# Patient Record
Sex: Female | Born: 1985 | Race: White | Hispanic: No | Marital: Single | Smoking: Never smoker
Health system: Southern US, Community
[De-identification: ages and names within clinical notes are randomized; demographics above are authoritative.]

## PROBLEM LIST (undated history)

## (undated) DIAGNOSIS — F32A Depression, unspecified: Secondary | ICD-10-CM

## (undated) DIAGNOSIS — R112 Nausea with vomiting, unspecified: Secondary | ICD-10-CM

## (undated) DIAGNOSIS — Z9889 Other specified postprocedural states: Secondary | ICD-10-CM

## (undated) DIAGNOSIS — O139 Gestational [pregnancy-induced] hypertension without significant proteinuria, unspecified trimester: Secondary | ICD-10-CM

## (undated) DIAGNOSIS — F329 Major depressive disorder, single episode, unspecified: Secondary | ICD-10-CM

## (undated) DIAGNOSIS — F419 Anxiety disorder, unspecified: Secondary | ICD-10-CM

## (undated) HISTORY — DX: Anxiety disorder, unspecified: F41.9

## (undated) HISTORY — PX: OTHER SURGICAL HISTORY: SHX169

## (undated) HISTORY — PX: WISDOM TOOTH EXTRACTION: SHX21

## (undated) HISTORY — PX: TONSILLECTOMY: SUR1361

---
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
## 2008-09-03 ENCOUNTER — Ambulatory Visit: Payer: Self-pay | Admitting: Diagnostic Radiology

## 2008-09-03 ENCOUNTER — Emergency Department (HOSPITAL_BASED_OUTPATIENT_CLINIC_OR_DEPARTMENT_OTHER): Admission: EM | Admit: 2008-09-03 | Discharge: 2008-09-03 | Payer: Self-pay | Admitting: Emergency Medicine

## 2011-11-02 ENCOUNTER — Ambulatory Visit (INDEPENDENT_AMBULATORY_CARE_PROVIDER_SITE_OTHER): Payer: 59 | Admitting: Behavioral Health

## 2011-11-02 DIAGNOSIS — Z638 Other specified problems related to primary support group: Secondary | ICD-10-CM

## 2011-11-02 DIAGNOSIS — F411 Generalized anxiety disorder: Secondary | ICD-10-CM

## 2011-11-02 DIAGNOSIS — F4322 Adjustment disorder with anxiety: Secondary | ICD-10-CM

## 2011-11-03 ENCOUNTER — Encounter (HOSPITAL_COMMUNITY): Payer: Self-pay | Admitting: Behavioral Health

## 2011-11-03 DIAGNOSIS — Z638 Other specified problems related to primary support group: Secondary | ICD-10-CM | POA: Insufficient documentation

## 2011-11-03 DIAGNOSIS — F411 Generalized anxiety disorder: Secondary | ICD-10-CM | POA: Insufficient documentation
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
## 2011-11-25 ENCOUNTER — Ambulatory Visit (INDEPENDENT_AMBULATORY_CARE_PROVIDER_SITE_OTHER): Payer: 59 | Admitting: Behavioral Health

## 2011-11-25 DIAGNOSIS — F411 Generalized anxiety disorder: Secondary | ICD-10-CM

## 2011-11-29 ENCOUNTER — Encounter (HOSPITAL_COMMUNITY): Payer: Self-pay | Admitting: Behavioral Health

## 2011-11-29 NOTE — Progress Notes (Signed)
THERAPIST PROGRESS NOTE  Session Time: 4:00  Participation Level: Active  Behavioral Response: CasualAlertAnxious/frustrated  Type of Therapy: Family Therapy  Treatment Goals addressed: Coping/communication  Interventions: Family Systems  Summary: Nancy Bennett is a 26 y.o. female who presents with anxiety.   Suicidal/Homicidal: Nowithout intent/plan  Therapist Response: I met with the client and her husband the first time today. I met previously with the client because her husband had just started a new job and could not leave work. We begin to explore some of the underlying issues related to him being in therapy. The clients husband indicated that he continues in intensive outpatient substance abuse therapy in [place]. He indicated that he is currently meeting weekly for group therapy and also meet with the psychiatrist there. He is on Suboxone treatments receiving no other pain medication. He indicated that 2 spinal conditions led to use of pain medication initially through a pain management clinic but when they found out that he was not using them correctly he was dismissed. He indicated that he began looking for pain medication on the street. He told the client for the first time that he contacted a mutual acquaintance of both of theirs who in turn referred him to someone else. He indicated that he had been buying pain medication in legally from 2 different people which was a surprise to the client. The client indicated that was a big part of what her struggles with is feeling that he is not giving her the full story of what took place as well as he has difficulty with emotional expression which has led to some trust issues. The client repeatedly expressed the fact, she values honesty and she cannot understand why he is not willing to "about what happened. He in part says that he feels guilty and ashamed of what he did and it is difficult to talk about but understands the need  for that. He also indicated that he is unsure what she is looking for. The client said that she does not want to go into work mode and interrogate the client and so is not a specific are directed she would be at work. The husband indicated that he would appreciate it if she would be more specific so he would know exactly how to answer. She indicated that he gives her very little feedback when she asked how he intensive outpatient therapy yes. He indicates that the same thing happens everyday and he doesn't see anything to elaborate on. She cited the fact that he talked about who he about the legal drugs from and wondered why she heard about that for the first time today. I suggested that we go back to a simple note is possible. I asked the client husband initially to sit and write down a time line of what is taking place since he first started taking pain medication but both client and husband did not for coping with that. I asked the client to write down a specific questions as possible wanted time and asked the husband to give all information that he has related to the question even if he thinks it is insignificant. The clients husband indicated that he is also always had a difficult time with emotional expression but it is even more difficult discussing issues related to him using pain medication and acquiring pain medication illegally. Asked the client her husband to work on one question at a time trying to connect intellectually to the client satisfaction hoping that this will  at least for some emotional expression with the husband. I will meet with them in 2 weeks  Plan: Return again in 2 weeks.  Diagnosis: Axis I: 300.02    Axis II: Deferred    [name], [facility] 11/29/2011

## 2011-12-06 ENCOUNTER — Ambulatory Visit (INDEPENDENT_AMBULATORY_CARE_PROVIDER_SITE_OTHER): Payer: 59 | Admitting: Behavioral Health

## 2011-12-06 DIAGNOSIS — F411 Generalized anxiety disorder: Secondary | ICD-10-CM

## 2011-12-07 ENCOUNTER — Encounter (HOSPITAL_COMMUNITY): Payer: Self-pay | Admitting: Behavioral Health
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
## 2012-01-02 ENCOUNTER — Ambulatory Visit (INDEPENDENT_AMBULATORY_CARE_PROVIDER_SITE_OTHER): Payer: 59 | Admitting: Behavioral Health

## 2012-01-02 ENCOUNTER — Encounter (HOSPITAL_COMMUNITY): Payer: Self-pay | Admitting: Behavioral Health

## 2012-01-02 DIAGNOSIS — F411 Generalized anxiety disorder: Secondary | ICD-10-CM

## 2012-01-02 NOTE — Progress Notes (Signed)
THERAPIST PROGRESS NOTE  Session Time: 10:00  Participation Level: Active  Behavioral Response: CasualAlertAnxious  Type of Therapy: Individual Therapy  Treatment Goals addressed: Coping  Interventions: CBT  Summary: Nancy Bennett is a 26 y.o. female who presents with adhd.   Suicidal/Homicidal: Nowithout intent/plan  Therapist Response: The client indicated that. An argument the previous Friday between she and her husband. She indicated that she woke up feeling irritable and had been without medication for a couple of days. She indicates was working different shifts she at times forgets it. She indicated that she was upset because her husband premium gas in the car even when she said she found sources for did not need to run premium he felt it did. She indicated that she call him on the phone and became upset. She indicated that he came home and tried to apologize that he had difficulties at work was under a lot of stress. She indicates that often he accuses her looking for a fight that reminds her that time she is trying so hard not to be like her mother. She indicated that her mother always has to be right even when she know she is wrong and is constantly her head. She indicates that at times she feels her husband is right and that she does try to fight because she feels that his emotional release for her. She indicates that she does not want to take medication long term. At least 2 or 3 times in session she referred herself as crazy. She indicated that despite her issues with her mother and not wanting to be like that. I told her that she had good insight as to what her mother was like and is well aware of not wanting to be like that. We talked about having a fine line of trying to make a point when you're correct because he could benefit the marriage with her relationship as opposed to simply arguing because you have to prove that you're right. I reassured her as a dad died not mean  that she was like her mother but that we could work on finding a balance. She did indicate that in a positive way her husband came by the house after her phone call him on Friday and he tried to express how he was emotionally. She indicated that at the time she did not see that as progress but didn't expect she can see that he is trying. She indicates that she no longer checks his phone but that he clear-cut also intact messages from the events that took place leading to him coming to counseling. She indicates that she had a tractor on his phone but he is able that she has chosen not to put her back on or even though he said she could. She indicates that it's hard her to think positively about trusting her husband since her have always been trust issues in her life and he was the one that she felt she could always trust. I told her I was proud of her for taking steps and not really in stating the tractor as well as not checking his phone. She indicates there have been no other behaviors the says he is attempting to buy any medication for his pain or doing anything they should not be doing ethically were legally. She indicates that he continues to go to therapy and is making somewhat of an effort to open up emotionally. We talked about responsibility the client to take in terms  of stretching her comfort zone and allow herself to trust. She forgiveness as not saying the incident that took place of right but letting go for the benefit of herself and for her relationship with her husband. I did talk about emotional expression. She indicated that a couple times she attempted to journal that felt very childish to her. We talked about other ways that she may be able to do that saying that she continues to store her anger up and in the little things that happened calls her to explode creating arguments between she and her husband. We talked about the process of the client becoming vulnerable with her husband. He is an  example of how he makes her feel in argument when he says he sounds like her mother. She indicates how painful it was. I suggested that she say to him that she knows the struggle she has in terms of not wanting to be" right all the time" like a mother and attempts to avoid that. We talked about how she could assertively say to her husband that when he says those type things have not only hurts her but makes her angry or which intensifies a disagreement. She indicated that she knew that would be difficult but she would attempt to had a conversation in the next week or so. She did contract for safety.  Plan: Return again in 2 weeks.  Diagnosis: Axis I: 300.02    Axis II: Deferred    Nancy Bennett, LPC 01/02/2012 11

## 2012-01-20 ENCOUNTER — Ambulatory Visit (HOSPITAL_COMMUNITY): Payer: Self-pay | Admitting: Behavioral Health

## 2012-01-25 ENCOUNTER — Ambulatory Visit (INDEPENDENT_AMBULATORY_CARE_PROVIDER_SITE_OTHER): Payer: 59 | Admitting: Behavioral Health

## 2012-01-25 DIAGNOSIS — F411 Generalized anxiety disorder: Secondary | ICD-10-CM

## 2012-01-26 ENCOUNTER — Encounter (HOSPITAL_COMMUNITY): Payer: Self-pay | Admitting: Behavioral Health
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
## 2012-01-31 ENCOUNTER — Encounter (HOSPITAL_COMMUNITY): Payer: Self-pay | Admitting: Behavioral Health

## 2012-01-31 ENCOUNTER — Ambulatory Visit (INDEPENDENT_AMBULATORY_CARE_PROVIDER_SITE_OTHER): Payer: 59 | Admitting: Behavioral Health

## 2012-01-31 DIAGNOSIS — F411 Generalized anxiety disorder: Secondary | ICD-10-CM
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
## 2012-02-17 ENCOUNTER — Ambulatory Visit (INDEPENDENT_AMBULATORY_CARE_PROVIDER_SITE_OTHER): Payer: 59 | Admitting: Behavioral Health

## 2012-02-17 ENCOUNTER — Encounter (HOSPITAL_COMMUNITY): Payer: Self-pay | Admitting: Behavioral Health

## 2012-02-17 DIAGNOSIS — F411 Generalized anxiety disorder: Secondary | ICD-10-CM

## 2012-02-17 NOTE — Progress Notes (Signed)
   THERAPIST PROGRESS NOTE  Session Time: 10:00  Participation Level: Active  Behavioral Response: CasualAlertAnxious  Type of Therapy: Individual Therapy  Treatment Goals addressed: Coping  Interventions: CBT  Summary: Nancy Bennett is a 26 y.o. female who presents with anxiety.   Suicidal/Homicidal: Nowithout intent/plan  Therapist Response: The client indicated that it had been in extremely busy past couple of weeks. She indicated that for one week she was in class[employer] 5 and into a Spanish class for 3 nights a week from 6 until 9. She also indicated that she picked up some extra work outside of her normal job for symmetric as much money. She indicated that for that reason she only check and finances on line one time per day and is not sure how she will do when her schedule slows down. Ask her church on again or schedules little less hectic. She indicated that her biggest stressor now his work. She indicated that there is always been a difficult relationship with her sergeant. She indicates that something about his manner always makes her cry work and she cannot figure what it is. She is uncomfortable doing at knowing that she works in a female-dominated world. 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 She indicates that he has spoken to her sergeant about an incident in which he felt she did not handle assertively enough. She indicates that she is not comfortable with that because she feels that she handles situations safely but not as aggressively as her lieutenant does. She does express some frustration with limited opportunity for advancement within the system. 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 She indicates that she feels like she will do a job of that and have some more structure and consistency  with her job hours. She said that she is also considering starting a family and would like to know she is in a better position professionally if they decide to have children. She did say that things are much more peaceful home and she feels like she is at peace and is finally let go of the situation and started with her husbands issues. She indicates her some stress with their 47-year-old daughter and that she is being oppositional. We talked about consistency and structure with the daughter. We also talked about the client looking as seeing other issue likes what she could reduce stress and so we can talk about that in the next session.  Plan: Return again in 2 weeks.  Diagnosis: Axis I: 300.02    Axis II: Deferred    Inez Rosato M, [facility] 02/17/2012

## 2012-03-07 ENCOUNTER — Encounter (HOSPITAL_COMMUNITY): Payer: Self-pay | Admitting: Behavioral Health

## 2012-03-07 ENCOUNTER — Ambulatory Visit (INDEPENDENT_AMBULATORY_CARE_PROVIDER_SITE_OTHER): Payer: 59 | Admitting: Behavioral Health

## 2012-03-07 DIAGNOSIS — F411 Generalized anxiety disorder: Secondary | ICD-10-CM

## 2012-03-07 NOTE — Progress Notes (Signed)
THERAPIST PROGRESS NOTE  Session Time: 11:00  Participation Level: Active  Behavioral Response: CasualAlertAnxious  Type of Therapy: Individual Therapy  Treatment Goals addressed: Coping  Interventions: CBT  Summary: Nancy Bennett is a 26 y.o. female who presents with adhd.   Suicidal/Homicidal: Nowithout intent/plan  Therapist Response: The client indicated that she had taken some positive steps of the past few weeks. She has applied to get her Master's degree beginning in January 2014. 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 She indicated that there are some things which are creating stress and trauma at work. She indicates that she is hoping that they will play out but is not is prepared to take an assertive stance and dealing with him at work. She indicates that she is in a difficult position either way and that she feels that the" good old boy network" works against her. She indicates right now one of her biggest frustrations is related to home issues. She indicates that she is working a full-time job, trying to pick up extra shifts for Christmas money, taking his Spanish class, and basically feels that she has become the" bad guy" at home with her husband daughter. She indicated that she feels her husband is not being firm enough with his daughter in getting her to do homework. She indicates that she is what always has to remind daughters are doing well she is reminded she doesn't but the husband is not doing that. She indicates that with both the daughter and her husband she has to tell the specifically what to do or chores with [place] completely and down. She indicates that she comes home to find dishes piled up in the sink where they would get a clean bowl of the dishwasher without intent or dishwasher were really. She indicates that she came in and found husband and daughter playing video games and on the  computer when the daughter had not done homework or nothing had been clean up around the home. She indicates that she has address that with her husband but does not feel he is emotionally invested in steps to take. She indicates that there are consequences for the daughter and the TV as well as summer favorite toys been taken away and that she does not seem to be affected by that. I asked the client doesn't would come back and said we could talk about that with him as a couple. She indicates that the former therapist told them that they have preformed at [facility] [facility] and that the husband thinks that she talk to him like a parent to child as opposed to adult to adult. She indicates that she does not think she does that but when she comes home after working along shift and still a school to do and nothing is better in the house that she feels like she has to address that. I would like to has to say about that encouraged him to participate in household responsibilities. The client also indicated that she wants to have a child but is willing to be a foster parent about it the husband would be more comfortable with that. He initially told the client that he did not want any children but that he would go to the foster adoption  process if that's what she wanted. She indicates her concerns are  that he would not help to the extent she needs it and he does not appear to be  able to reassure her that he will. We did review some coping skills throughout the client deal with stress at work and at home. I will see her again in 2 weeks and hopefully meet with she and her husband in 4 weeks.  Plan: Return again in 2 weeks.  Diagnosis: Axis I: 300.02    Axis II: Deferred    Malynda Smolinski M, LPC 03/07/2012

## 2012-03-21 ENCOUNTER — Ambulatory Visit (INDEPENDENT_AMBULATORY_CARE_PROVIDER_SITE_OTHER): Payer: 59 | Admitting: Behavioral Health

## 2012-03-21 DIAGNOSIS — F411 Generalized anxiety disorder: Secondary | ICD-10-CM

## 2012-03-22 ENCOUNTER — Encounter (HOSPITAL_COMMUNITY): Payer: Self-pay | Admitting: Behavioral Health

## 2012-03-22 NOTE — Progress Notes (Signed)
   THERAPIST PROGRESS NOTE  Session Time: 3:00  Participation Level: Active  Behavioral Response: CasualAlertAnxious/irritable  Type of Therapy: Individual Therapy  Treatment Goals addressed: Coping  Interventions: CBT  Summary: Nancy Bennett is a 26 y.o. female who presents with anxiety and irritability.   Suicidal/Homicidal: Nowithout intent/plan  Therapist Response: The client indicated there had been very little change in terms of events in her life over the past couple of weeks. She indicated that she is in a place where she is" status quo" with work. She indicates that she is just keeping her head down and doing her job. She indicates that she is avoiding trauma and is being careful and who she talks to and what she talked about work. She indicates that she knows that she can't advance because of the suspension for almost one year so she is just going to do her job as well as she can and keep her head down. She indicated that her biggest frustration continues to be with her husband and daughter and her efforts in the home. She indicates that with finishing up a Spanish class, starting her first master's level class in January and working full-time she does not have the time to keep up the house much she would like to. She cited an example from this morning in which her daughter was supposed some of the dishwasher on Monday night and did not do it until Tuesday. She indicated that the husband is supposed to put the dirty dishes back to the dishwasher. She indicated he did not do it all accusing her Tuesday night and when she asked him to do it before he went to work on Wednesday morning he told her he not to ask her to do chores before he leaves to go to work. She indicated that made her irritable. She indicated that there was a huge polyp dishes and that the daughter had hot milk on top of which had begun to smell sour. The client cannot understand why her husband especially cannot see  things like that need to be done about her asking him to do that. She indicates that Diplomatic Services operational officer, dishes follow up. She indicates that he will on occasion clean the kitchen but she can be spending all day cleaning up and he sits and watches without volunteering to do anything but clean the kitchen with the exception of the floor. She indicates that she knows that she needs to approach a different way because she is frustrated and angry and insisted way that her husband shuts down. We reviewed ways that she could address this with him that may not lead to an argument. She indicated that she has tried" reverse psychology" a lot of stuff build up. She said that indicated in future piles of dishes in the sink on a huge piles of laundry, and the yard that needed to be mowed for weeks. We also talked about ways o'clock and that her frustration and how she takes care of herself and very stressful period in her life. She does contract for safety saying she has no homicidal or suicidal ideation Plan: Return again in 2 weeks.  Diagnosis: Axis I: 300.02    Axis II: Deferred    Kyjuan Gause M, [facility] 03/22/2012

## 2012-04-05 ENCOUNTER — Ambulatory Visit (INDEPENDENT_AMBULATORY_CARE_PROVIDER_SITE_OTHER): Payer: 59 | Admitting: Behavioral Health

## 2012-04-05 DIAGNOSIS — F411 Generalized anxiety disorder: Secondary | ICD-10-CM

## 2012-04-06 ENCOUNTER — Encounter (HOSPITAL_COMMUNITY): Payer: Self-pay | Admitting: Behavioral Health

## 2012-04-06 NOTE — Progress Notes (Signed)
THERAPIST PROGRESS NOTE  Session Time: 4:00  Participation Level: Active  Behavioral Response: CasualAlertpleasant  Type of Therapy: Family Therapy  Treatment Goals addressed: Coping  Interventions: CBT  Summary: Nancy Bennett is a 27 y.o. female who presents with anxiety and some family discord.   Suicidal/Homicidal: Nowithout intent/plan  Therapist Response: I met with the client and her husband. Both indicated that they felt they had done a better job of communicating with each other. He felt that was because he was responding more quickly to questions or concerns that the client expressed. Client indicated that he had done much better job of that and she was appreciative of that. She indicated that he had been much more affectionate in terms of being playful such as typically in her or hunting her unexpectedly. He indicates a discomfort with that saying that was not modeled for him growing up but that he does like to response that he gets he makes the effort. Talked about the 5 love languages. The client has looked at it on line and showed up to the husband I encouraged him to read a book together explore what her love languages are in order to improve communication. The client also expressed that she did the last session frustration with the husband and daughter not doing as much around the house is good. The husband continues to say that he will do things that she would just knowing what to do what appears not to know how to take initiative in doing something other than cleaning the kitchen. It was agreed that in order to model this with her daughter have you ever takes the daughter up from daycare will Lodema Hong, immediately after getting home working on chores so that both adults in the clients child are doing chores which take some of the stress off the client. It was still some discord in terms of how to" fight fairly. Both indicated that if one gets angry, the other automatically  gets angry and escalates her for anything can get solved. The client indicates that she struggles with holding things in versus feeling like she is making all of the time. The husband indicated that if she would tell him things when they're presented instead of holding them in their would not be as many arguments. He was clear that the client was getting frustrated feeling like she was getting blamed although she indicated her anger level was initially a 5/10 and was reduced to a 2/10 by the end of the session. The husband indicated that he does not like to do chores in the morning because he is not a morning person an imbalance fact that he can do a better job of doing them in the evening. I again suggested a chores chart for all of the family. He indicated that her is one of place for the clients daughter which he does not hear to therefore the need for one of the adults to do the chores with her in the afternoon initially. I suggested that they also delineate chores. It appears to be that the difference comes in cooking versus cleaning up in the kitchen. The husband feels that the client is not cleaning up enough after she cooks or after she makes his sandwiches. She indicated that she makes sandwiches for him because he was spending money eating out when she did not and says that she cooks most of the time so she has to do the cleaning up. The agreement is that a few  cooking cleaning up and that does not appear to be something that the client and her husband or seeing a child at this time. We'll continue to work on that in the next session. Both client and husband contract for safety. He indicates that he is doing very well with the classes that he is taking his pain is somewhat better.  Plan: Return again in 2 weeks.  Diagnosis: Axis I: 300.02    Axis II: Deferred    Malic Rosten M, [facility] 04/06/2012

## 2012-04-27 ENCOUNTER — Ambulatory Visit (HOSPITAL_COMMUNITY): Payer: Self-pay | Admitting: Behavioral Health

## 2012-05-17 ENCOUNTER — Ambulatory Visit (HOSPITAL_COMMUNITY): Payer: Self-pay | Admitting: Behavioral Health

## 2012-06-14 ENCOUNTER — Ambulatory Visit (HOSPITAL_COMMUNITY): Payer: Self-pay | Admitting: Behavioral Health

## 2012-11-05 ENCOUNTER — Ambulatory Visit (INDEPENDENT_AMBULATORY_CARE_PROVIDER_SITE_OTHER): Payer: 59 | Admitting: Behavioral Health

## 2012-11-05 DIAGNOSIS — F411 Generalized anxiety disorder: Secondary | ICD-10-CM

## 2012-11-06 ENCOUNTER — Encounter (HOSPITAL_COMMUNITY): Payer: Self-pay | Admitting: Behavioral Health
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
## 2012-11-07 ENCOUNTER — Ambulatory Visit (HOSPITAL_COMMUNITY): Payer: Self-pay | Admitting: Behavioral Health

## 2012-11-08 ENCOUNTER — Telehealth (HOSPITAL_COMMUNITY): Payer: Self-pay | Admitting: Behavioral Health

## 2012-11-08 NOTE — Telephone Encounter (Signed)
I spoke with the client about referral to a licensed marriage and family therapist. I contacted to one of whom is not taking the clients and the other could not take the clients insurance. I referred her to the American Association of marriage and family therapist web site and also advised her to call her insurance company and see who is an improved provider for her.

## 2012-11-13 ENCOUNTER — Ambulatory Visit (HOSPITAL_COMMUNITY): Payer: Self-pay | Admitting: Behavioral Health

## 2012-11-26 ENCOUNTER — Ambulatory Visit (HOSPITAL_COMMUNITY): Payer: Self-pay | Admitting: Behavioral Health

## 2012-11-29 ENCOUNTER — Ambulatory Visit (INDEPENDENT_AMBULATORY_CARE_PROVIDER_SITE_OTHER): Payer: 59 | Admitting: Licensed Clinical Social Worker

## 2012-11-29 DIAGNOSIS — Z638 Other specified problems related to primary support group: Secondary | ICD-10-CM

## 2012-11-29 DIAGNOSIS — Z639 Problem related to primary support group, unspecified: Secondary | ICD-10-CM

## 2012-11-29 DIAGNOSIS — F411 Generalized anxiety disorder: Secondary | ICD-10-CM

## 2012-11-30 NOTE — Progress Notes (Signed)
   THERAPIST PROGRESS NOTE  Session Time: 4:00 - 5:00  Participation Level: Active  Behavioral Response: Well GroomedAlertAnxious and Irritable  Type of Therapy: Family Therapy  Treatment Goals addressed: Anger, Anxiety and Coping  Interventions: Motivational Interviewing and Family Systems  Summary: Nancy Bennett is a 27 y.o. female who presents with anger and resentment.  Cartina brought her husband in to see if they could save their marriage.  There is a lot of tension between them.  She is obviously angry and resentful of him and he is angry in defensive posture.  Tend not to be overly expressive.  She gave more of a history of their marriage - recently they had separated and he is now back in the house.  He has a daughter age 12 who lives with him full time. Have the impression that Inez deals more with his daughter than he does.  She did comment on how he did not back her up with discipline. He tends to tune out at home - she is overly tuned in - a fairly typical traditional female female role in the home.  She is basically a good kid and the main issue is her doing her chores. Their main stressor is money and husband having trouble with pain pill addiction.  He had back surgery which got him started on pain pills and it grew from there. It got bad enough where he was buying from the streets and spending a lof of their money.  She handles the bills and is the perfectionistic type so this has really upset her - not only the betrayal but the loss of finances in the home. He is in Airline pilot and she is a Emergency planning/management officer.  Spent the time discussing child raising and roles - step mother and father.  And discussed their actual views on money.  Since they were separated he has put his money in his own account and she cannot access it.  He gives her an amount needed to cover bills.  She is compulsive about paying bills so she wants to do that.  He does not care about that. He is more Passenger transport manager.   Discussed how it is important that they both know what is going on with money - could they meet once a month and go over their bills.  Do they have bigger goals that require saving?  Retirement, college, a bigger house, or a special item.  They agreed to go home and before next visit to work out rules and consequences for not doing what is supposed to be done.  Also sit down and look at finances.  Suicidal/Homicidal: No  Plan: Return again in 2 weeks.  Diagnosis: Axis I: Generalized Anxiety Disorder    Axis II: Deferred    Holden Draughon,JUDITH A, LCSW 11/30/2012

## 2012-12-12 ENCOUNTER — Ambulatory Visit (HOSPITAL_COMMUNITY): Payer: Self-pay | Admitting: Behavioral Health

## 2012-12-13 ENCOUNTER — Ambulatory Visit (INDEPENDENT_AMBULATORY_CARE_PROVIDER_SITE_OTHER): Payer: 59 | Admitting: Licensed Clinical Social Worker

## 2012-12-13 DIAGNOSIS — F411 Generalized anxiety disorder: Secondary | ICD-10-CM

## 2012-12-18 NOTE — Progress Notes (Signed)
   THERAPIST PROGRESS NOTE  Session Time: 4:00 - 5:00  Participation Level: Active  Behavioral Response: CasualAlertAngry  Type of Therapy: Family Therapy  Treatment Goals addressed: Anger and Anxiety  Interventions: Motivational Interviewing and Family Systems  Summary: Nancy Bennett is a 27 y.o. female who presents with anxiety.  [name] came in with husband.  They reported that they did talk about a chore list for his daughter and consequences.  They also went over money and how to work on it now that husband does not just want to put things together as before - he will give her a certain amount and he has it set up to automatically pay other bills from his account so they will not be late.  They were both still very serious almost looking angry.  Commented on mood - they did not agree or disagree.  Husband never smiles so  I took a risk and joked with him a little. And he finally did smile - apparently he has a reputation of not smiling.  He has a criminal record - he acted out as a child, teen and younger adult.  Breaking and entering, drugs, assault.  The first time he was caught doing something wrong was when he was 5 - he was smoking a cigarette.  He never felt bad about what he did - always fought authority and did not like rules.  He stopped all of that after his daughter was born.  His wife does not see the child because of drug use.  Interesting that he is married to a Emergency planning/management officer.  He needs time to think about things before answering [facility] request. He gets passive resistive and she gets resentful.  They are both feeling mistrustful and resentful. Reviewed with them some female/female differences that they may be taking personally instead of realizing that is a trait.  Moving more along that line suggested they work with Rosalio Macadamia and take the online test which will also point out your differences and how best to work with that difference.   Suicidal/Homicidal:  No  Plan: Return again in 2 weeks.  Diagnosis: Axis I: Generalized Anxiety Disorder    Axis II: Deferred    [name],JUDITH A, LCSW 12/18/2012

## 2012-12-27 ENCOUNTER — Ambulatory Visit (INDEPENDENT_AMBULATORY_CARE_PROVIDER_SITE_OTHER): Payer: 59 | Admitting: Licensed Clinical Social Worker

## 2012-12-27 DIAGNOSIS — Z639 Problem related to primary support group, unspecified: Secondary | ICD-10-CM

## 2012-12-27 DIAGNOSIS — F411 Generalized anxiety disorder: Secondary | ICD-10-CM

## 2012-12-27 DIAGNOSIS — Z638 Other specified problems related to primary support group: Secondary | ICD-10-CM

## 2013-01-09 ENCOUNTER — Ambulatory Visit (INDEPENDENT_AMBULATORY_CARE_PROVIDER_SITE_OTHER): Payer: 59 | Admitting: Licensed Clinical Social Worker

## 2013-01-09 NOTE — Progress Notes (Signed)
   THERAPIST PROGRESS NOTE  Session Time: 4:00 - 5:00  Participation Level: Active  Behavioral Response: CasualAlertTense  Type of Therapy: Family Therapy  Treatment Goals addressed: Coping  Interventions: Motivational Interviewing and Family Systems  Summary: Nancy Bennett is a 27 y.o. female who presents with marital issues.   Marieta came late - conflict at work.  They were both still quite serious in their demeanor. A little more relaxed than first two sessions.  They did take the [employer] test and she turned out to be a ENFJ and he was a INTP.  These fit what has been observed in the sessions.  They certainly have opposite tendencies and it would not be impossible for them to work with those difference.  He needs time to think about something she may want to do so she needs to not get upset if she does not get an answer right away to her question.  [name] does feel comfortable with more alone time than [place].  He is also more laid back as a parent than she is which is difficult since she is the step parent.   She does need his support but he is not geared to structure and rules.  He clearly values his child for he stopped the acting out behavior right after she was born.  He is not into pleasing people as much as Lanora Manis - she obviously does like rules and regulations for she is a Emergency planning/management officer and he is negative about rules.  Does more of what he wants to do.  Leatta may be having more difficulty getting emotional needs met for [name] is more internal and is satisfied with alone time. Marland Kitchen He is not as into being organized about bill paying as Miette is so it is best that she takes that on.  They have worked out a way of handling money - he is having his bills automatically taken out of his account and they figured out what more she needed and that will automatically be put in her account.  He has no trouble taking care of his needs but Icesis does for she is more outwardly  focussed on other people.   Suicidal/Homicidal: No  Therapist Response: They could gain a lot by working with this information  Plan: Return again in 2 weeks.  Diagnosis: Axis I: Generalized Anxiety Disorderand Family discord    Axis II: Deferred    Johniya Durfee,JUDITH A, LCSW 01/09/2013

## 2013-01-24 ENCOUNTER — Ambulatory Visit (INDEPENDENT_AMBULATORY_CARE_PROVIDER_SITE_OTHER): Payer: 59 | Admitting: Licensed Clinical Social Worker

## 2013-01-30 NOTE — Progress Notes (Unsigned)
   THERAPIST PROGRESS NOTE  Session Time: 4:00- 5:00  Participation Level: Active  Behavioral Response: CasualAlertIrritable  Type of Therapy: Individual Therapy  Treatment Goals addressed: Anger and Coping  Interventions: Motivational Interviewing and Supportive  Summary: MAISEY DEANDRADE is a 27 y.o. female who presents with ***.   Suicidal/Homicidal: No  Therapist Response: ***  Plan: Return again in 2 weeks.  Diagnosis: Axis I: Generalized Anxiety Disorder    Axis II: Deferred    Allycia Pitz,JUDITH A, LCSW 01/30/2013

## 2013-02-07 ENCOUNTER — Ambulatory Visit (INDEPENDENT_AMBULATORY_CARE_PROVIDER_SITE_OTHER): Payer: 59 | Admitting: Licensed Clinical Social Worker

## 2013-02-12 ENCOUNTER — Ambulatory Visit (HOSPITAL_COMMUNITY): Payer: 59 | Admitting: Licensed Clinical Social Worker

## 2013-02-15 ENCOUNTER — Ambulatory Visit (HOSPITAL_COMMUNITY): Payer: 59 | Admitting: Licensed Clinical Social Worker

## 2013-02-20 ENCOUNTER — Ambulatory Visit (INDEPENDENT_AMBULATORY_CARE_PROVIDER_SITE_OTHER): Payer: 59 | Admitting: Licensed Clinical Social Worker

## 2013-02-20 DIAGNOSIS — F411 Generalized anxiety disorder: Secondary | ICD-10-CM

## 2013-02-20 DIAGNOSIS — Z639 Problem related to primary support group, unspecified: Secondary | ICD-10-CM

## 2013-02-20 DIAGNOSIS — Z638 Other specified problems related to primary support group: Secondary | ICD-10-CM

## 2013-03-18 NOTE — Progress Notes (Signed)
   THERAPIST PROGRESS NOTE  Session Time: 4:00 -5:00  Participation Level: Active  Behavioral Response: CasualAlertAngry and Anxious  Type of Therapy: Family Therapy  Treatment Goals addressed: Anger and Anxiety  Interventions: Motivational Interviewing, Supportive and Family Systems  Summary: Nancy Bennett is a 27 y.o. female who presents with Aalia and her husband.  They continue to be very serious in the sessions - looking almost angry.  They are hard to get lighter in mood - no sense of humor.  Or it takes a lot for them to see the humor in a situation. They claim to want to continue working on their relationship.  They are doing things together as a family and claim they are having some fun.  :They feel they are working with money better and his daughter.Marland Kitchen  However, his role needs to get bigger - he lays back and lets Kallyn deal with her. Discussed the importance of the father in a little girls life as well as a boys  He claims he does not know what to do with her - gave hm some suggestions that he felt he could do.  Kaydance will have to learn how to get out ot the way and let them be together. They have plans to be with both famillies over the holidays.  They claim that is not stressful.:Discussed further how wife overwhelms him with verbal behavior - he needs time to think things through  -  Suggested that they work more with what they learned from Rosalio Macadamia testing.  Suicidal/Homicidal: No  Therapist Response:   Do not feel that our work was very productive.  Plan to give them some resources for continued work on their relationship.  Wife has a lot of resentment to let go of and husband has to stop passive aggressive behavior.  Plan: Return again in 4 weeks.  Diagnosis: Axis I: Generalized Anxiety Disorder, Family discord     Axis II: Deferred    Maruice Pieroni,JUDITH A, LCSW 03/18/2013

## 2013-05-09 ENCOUNTER — Ambulatory Visit (INDEPENDENT_AMBULATORY_CARE_PROVIDER_SITE_OTHER): Payer: 59 | Admitting: Psychiatry

## 2013-05-09 ENCOUNTER — Encounter (INDEPENDENT_AMBULATORY_CARE_PROVIDER_SITE_OTHER): Payer: Self-pay

## 2013-05-09 DIAGNOSIS — F329 Major depressive disorder, single episode, unspecified: Secondary | ICD-10-CM | POA: Insufficient documentation

## 2013-05-09 DIAGNOSIS — F3289 Other specified depressive episodes: Secondary | ICD-10-CM

## 2013-05-09 DIAGNOSIS — Z7189 Other specified counseling: Secondary | ICD-10-CM

## 2013-05-09 DIAGNOSIS — Z638 Other specified problems related to primary support group: Secondary | ICD-10-CM

## 2013-05-09 DIAGNOSIS — F32A Depression, unspecified: Secondary | ICD-10-CM

## 2013-05-09 DIAGNOSIS — Z630 Problems in relationship with spouse or partner: Secondary | ICD-10-CM
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
## 2013-05-29 ENCOUNTER — Ambulatory Visit (INDEPENDENT_AMBULATORY_CARE_PROVIDER_SITE_OTHER): Payer: 59 | Admitting: Psychiatry

## 2013-05-29 DIAGNOSIS — F329 Major depressive disorder, single episode, unspecified: Secondary | ICD-10-CM

## 2013-05-29 DIAGNOSIS — F411 Generalized anxiety disorder: Secondary | ICD-10-CM

## 2013-05-29 DIAGNOSIS — F32A Depression, unspecified: Secondary | ICD-10-CM

## 2013-05-29 DIAGNOSIS — F3289 Other specified depressive episodes: Secondary | ICD-10-CM

## 2013-05-29 NOTE — Progress Notes (Signed)
THERAPIST PROGRESS NOTE  Session Time: 8:00-8:50 pm  Participation Level: Active  Behavioral Response: Neat and Well GroomedAlertDepressed  Type of Therapy: Individual Therapy  Treatment Goals addressed: Created treatment plan  Interventions: Created treatment plan  Summary:    INDIVIDUAL TREATMENT PLAN       Date of Admission:  05-09-13 Date of Treatment Plan: 05-29-13     Service: [x]  Group  [x]  Individual [x]  Comprehensive []  Revised due to: []  Change in Diagnosis     []  Change in Service     []  Expiration of Previous Treatment Plan  Diagnosis:  Depression Major recurrent moderate  Recommended Treatment:  [x]  Individual Therapy  []  Family Therapy  [x]  Couples Therapy  [x]  Chemical Dependency (CD-IOP) group therapy 3x weekly and 1:1 individual therapy as required  []  Mental Health (MH-IOP) group therapy 5x weekly and 1:1 individual therapy as required   Possible Negative Outcomes of Treatment: Symptoms of mental illness may increase and changes in relationship can occur during the course of treatment.  Client's Strengths (What are the strengths and resources that will help clients move towards their goals):  Motivated, determined, employed, getting masters degree   Personal Recovery Goal(s)/Client's Goals for Treatment (use client's words):  To be happy in my marriage.     Objective Behavioral Criteria for Discharge: Client will maintain stability in the community as demonstrated by the following:   No suicidal behaviors for 3 months.   No hospitalizations or emergency room visits for psychological reasons for 3 months.   No SIB behaviors requiring medical attention for 3 months.   Emotional regulation by reporting distress at an average of 5/10 or below for 3 months.   Demonstration of healthy community supports by initiating peer contact at least twice weekly separate from the treatment environment.   Agreed-upon transition plan to a less restrictive  setting.  INFORMED CONSENT TO TREATMENT. I acknowledge that I have been informed of and am able to understand my diagnosis, the nature and purpose of the proposed treatment, the risks and benefits of the proposed treatment, the possible negative outcomes of and possible alternatives to the proposed treatment, the probability that the proposed treatment will be successful, and the prognosis if I choose not to receive the treatment. Further, I have been informed of the extent and limits of confidentiality of treatment information. I understand the risks, benefits, possible negative outcomes of and alternatives to this proposed treatment, and my signature below verifies that I have actively participated in the development of my treatment plan and that I am willingly and voluntarily agreeing to the treatment outlined in this plan. Assessed Needs (Problem) Client's Goals (what client will do)   Interventions (what staff will do)   1. Depression     o Have improved mood and return to a healthier level of functioning.   o Identify the causes for depressed mood and learn ways to cope with depression.   o  Explore how depression is experienced in day-to-day living; encourage sharing of feelings of depression to gain insight into causes and create a coping plan to manage depression symptoms. o Provide education about depression to help patient identify causes, symptoms and triggers. o Teach and encourage the use of coping skills for management of depression symptoms.  o Teach WRAP skills for depression management.     2. Marital Stress     o Learns ways to be happier in marriage to husband.  o Educate on love languages to help Pt better  understand how to communicate love to her husband. o Encourage building stronger connections as a couple through daily gestures of kindness towards husband.  o Encourage reconnection to the positive aspects of the marriage. o Process feelings associated with marital  stress. o Use CBT to help Pt explore behaviors contributing to continued stress and conflict in the marriage    3. Communication    o Learn ways to have better communication with my husband  o Teach effective communication skills.  o Teach problem solution skills o Encourage use of assertive communication.     I have [] received [] declined a copy of this treatment plan.  Client: Date: Guardian/Parent: Date:   Therapist: Date: Licensed Provider (if applicable): Date:    Six month review:   I have reviewed this treatment plan and consider it still valid. Client: Date: Guardian/Parent: Date:      Suicidal/Homicidal: No  Therapist Response: Created treatment plan  Plan: Return again in 2 weeks.  Diagnosis: Axis I: Depression major recurrent moderat       Benjerman Molinelli E, LCSW 05/29/2013

## 2013-06-12 ENCOUNTER — Encounter (INDEPENDENT_AMBULATORY_CARE_PROVIDER_SITE_OTHER): Payer: Self-pay

## 2013-06-12 ENCOUNTER — Ambulatory Visit (INDEPENDENT_AMBULATORY_CARE_PROVIDER_SITE_OTHER): Payer: 59 | Admitting: Psychiatry

## 2013-06-12 DIAGNOSIS — F329 Major depressive disorder, single episode, unspecified: Secondary | ICD-10-CM

## 2013-06-12 DIAGNOSIS — Z639 Problem related to primary support group, unspecified: Secondary | ICD-10-CM

## 2013-06-12 DIAGNOSIS — F3289 Other specified depressive episodes: Secondary | ICD-10-CM

## 2013-06-12 DIAGNOSIS — Z638 Other specified problems related to primary support group: Secondary | ICD-10-CM

## 2013-06-12 DIAGNOSIS — F32A Depression, unspecified: Secondary | ICD-10-CM

## 2013-06-12 DIAGNOSIS — F411 Generalized anxiety disorder: Secondary | ICD-10-CM

## 2013-06-12 NOTE — Progress Notes (Signed)
   THERAPIST PROGRESS NOTE  Session Time: 11:00-11:50 am  Participation Level: Active  Behavioral Response: Neat and Well GroomedAlertDepressed  Type of Therapy: Individual Therapy  Treatment Goals addressed: Marital Stress and Communication  Interventions: CBT and Solution Focused  Summary: Mervyn Gaylizabeth R Buskirk is a 28 y.o. female who presents with mild depressed mood and affect. Pt said she was excited to share that she received a promotion at work that she is looking forward to and it also comes with a significant raise.  Pt said the stress in her marriage to her husband [name](Josh) continues to be her primary struggle and gave examples from the past two weeks involving differences in finances between the two of them. Pt said he is often late with the bills he is scheduled to pay and is dishonest with having paid them. Pt struggled to see what current traits she loves about him and said she can only think back to qualities she liked about him before they got married. Pt like his free spirit and sense of humor, but now those traits are what annoys her due to his inability to be more structured. Pt said she did have fun with him when he went out last week with them, which was out of his character, but Pt said she did not acknowledge this and give him credit. Pt said she did complete her homework assignment of trying to do something kind for him daily but did not get the responses from him she thought she would and instead he seemed critical of her gestures. Pt agreed to keep trying.   Suicidal/Homicidal: No  Therapist Response: Assessed overall level of functioning per Pt self report, educated on healthier ways to communicate with husband that is less critical and more affirming, encouraged continued daily acts of kindness towards him, processed differences in how each of them manage finances and explored options to this stressors.   Plan: Return again in 2 weeks. Continue working on effective  communication with husband and daily acts of kindness towards him to help the relationship strengthen.   Diagnosis: Axis I: Depression Major Recurrent Moderate       Lundyn Coste E, LCSW 06/12/2013

## 2013-06-26 ENCOUNTER — Ambulatory Visit (HOSPITAL_COMMUNITY): Payer: Self-pay | Admitting: Psychiatry

## 2013-06-27 ENCOUNTER — Encounter (INDEPENDENT_AMBULATORY_CARE_PROVIDER_SITE_OTHER): Payer: Self-pay

## 2013-06-27 ENCOUNTER — Ambulatory Visit (INDEPENDENT_AMBULATORY_CARE_PROVIDER_SITE_OTHER): Payer: 59 | Admitting: Psychiatry

## 2013-06-27 DIAGNOSIS — F3289 Other specified depressive episodes: Secondary | ICD-10-CM

## 2013-06-27 DIAGNOSIS — F329 Major depressive disorder, single episode, unspecified: Secondary | ICD-10-CM

## 2013-06-27 DIAGNOSIS — Z639 Problem related to primary support group, unspecified: Secondary | ICD-10-CM

## 2013-06-27 DIAGNOSIS — Z638 Other specified problems related to primary support group: Secondary | ICD-10-CM

## 2013-06-27 DIAGNOSIS — F32A Depression, unspecified: Secondary | ICD-10-CM

## 2013-06-27 NOTE — Progress Notes (Signed)
   THERAPIST PROGRESS NOTE  Session Time: 3:00-3:50 pm  Participation Level: Active  Behavioral Response: CasualAlertDepressed  Type of Therapy: Individual Therapy  Treatment Goals addressed: Marital Stress  Interventions: Solution Focused and Supportive  Summary: Nancy Bennett is a 28 y.o. female who presents with depressed mood and tearful affect. Pt said she called today to see if there were any openings because she really needed to get support. Pt was able to get an appointment same day. Pt said she and her husband are not doing well in their marriage and she found a bottle of prescriptions pain pills hidden in a cabinet in her home that had another persons name on it. Pt said she knew her husband was using again and when she confronted him about it he lied and denied it at first. Then said he took it from a house he was working on through his job but said it was several months ago. Pt processed feelings of confusion, betrayal, sadness and anger. Pt said she is feeling more dissatisfied in her marriage as it goes on and said the drug abuse is just a portion of it. She said she is upset about how distant he is and how he does not communicate with her and how he does not make any effort around the house and neglects bills. Pt said she feels "done in the marriage". Pt agreed to take some space through the weekend from him and texted him during the session to see if he can stay somewhere through the weekend to help them both have some temporary space to start to figure things out. Pt said she has an appointment Monday morning with writer to follow up .   Suicidal/Homicidal: No  Therapist Response: Assessed overall level of depression symptoms per Pt self report. Processed feelings associated with husbands drug use and marital stress, created a plan to get Pt through the weekend and allow space for both until Pts next appointment. Encouraged Pt to call if she needs additional support in the  meantime.   Plan: Return again Monday morning  Diagnosis: Axis I: Depression Major Recurrent Moderate       Antha Niday E, LCSW 06/27/2013

## 2013-07-01 ENCOUNTER — Ambulatory Visit (INDEPENDENT_AMBULATORY_CARE_PROVIDER_SITE_OTHER): Payer: 59 | Admitting: Psychiatry

## 2013-07-01 ENCOUNTER — Encounter (INDEPENDENT_AMBULATORY_CARE_PROVIDER_SITE_OTHER): Payer: Self-pay

## 2013-07-01 DIAGNOSIS — Z638 Other specified problems related to primary support group: Secondary | ICD-10-CM

## 2013-07-01 DIAGNOSIS — F3289 Other specified depressive episodes: Secondary | ICD-10-CM

## 2013-07-01 DIAGNOSIS — Z639 Problem related to primary support group, unspecified: Secondary | ICD-10-CM

## 2013-07-01 DIAGNOSIS — F329 Major depressive disorder, single episode, unspecified: Secondary | ICD-10-CM

## 2013-07-01 DIAGNOSIS — F32A Depression, unspecified: Secondary | ICD-10-CM

## 2013-07-01 NOTE — Progress Notes (Signed)
   THERAPIST PROGRESS NOTE  Session Time: 8:00-8:50 am  Participation Level: Active  Behavioral Response: CasualAlertDepressed  Type of Therapy: Individual Therapy  Treatment Goals addressed: Depression, Marital Stress  Interventions: Solution Focused and Supportive  Summary: Nancy Bennett is a 28 y.o. female who presents with depressed mood and tearful affect. Pt said she is still uncertain what to do with her marriage to her husband, but is struggling to see why she loves him and what is making her stay with him. Pt said they both stayed in the home this past weekend due to him being unwilling to leave and her not having a place to stay on such short notice. Pt said she ignored him until Sunday to give herself time to think and process. Pt said she talked to him last night and asked him if he would be willing to seek recovery treatment again and he seemed indifferent, but willing he she wanted him to. Pt said it is his indifference that is pushing her further away from him. She [facility] to give to him for recovery support and agreed to ask him if he would be willing to attend the next session with writer and Pt. Pt also processed feelings of sadness and frustration about her marriage.    Suicidal/Homicidal: No  Therapist Response: Assessed overall level of depression and marital stress per Pt self report. Processed feelings of sadness and confusion about her marriage, provided education on recovery programs and encouraged Pt to invite her husband to next session to discuss marital distress.   Plan: Return again in 2 weeks.  Diagnosis: Axis I: Depression Major Recurrent Moderate        Kimberlie Csaszar E, LCSW 07/01/2013

## 2013-07-15 ENCOUNTER — Encounter (INDEPENDENT_AMBULATORY_CARE_PROVIDER_SITE_OTHER): Payer: Self-pay

## 2013-07-15 ENCOUNTER — Ambulatory Visit (INDEPENDENT_AMBULATORY_CARE_PROVIDER_SITE_OTHER): Payer: 59 | Admitting: Psychiatry

## 2013-07-15 DIAGNOSIS — F329 Major depressive disorder, single episode, unspecified: Secondary | ICD-10-CM

## 2013-07-15 DIAGNOSIS — F3289 Other specified depressive episodes: Secondary | ICD-10-CM

## 2013-07-15 DIAGNOSIS — F32A Depression, unspecified: Secondary | ICD-10-CM

## 2013-07-15 DIAGNOSIS — Z638 Other specified problems related to primary support group: Secondary | ICD-10-CM

## 2013-07-15 DIAGNOSIS — Z639 Problem related to primary support group, unspecified: Secondary | ICD-10-CM

## 2013-07-15 NOTE — Progress Notes (Signed)
   THERAPIST PROGRESS NOTE  Session Time: 9:00-9:50 am  Participation Level: Active  Behavioral Response: Casual, Neat and Well GroomedAlertDepressed  Type of Therapy: Individual Therapy  Treatment Goals addressed: Depression and Marital Stress  Interventions: CBT, Solution Focused and Supportive  Summary: Nancy Bennett is a 28 y.o. female who presents with moderate depressed mood and affect. Pt states her marital stress continues to be her primary stressor. Pt said she moved out of the house and is staying with a friend of hers. Pt said she is still talking to her husband daily and trying to decide if she wants to try to work on saving the marriage. Pt expressed feeling like she is putting in more effort than he is and that she acts like his mother by structuring and organizing his life for him and looking after him. Pt wants him to put forth more effort in the relationship and be more of a partner. Pt wants him to get into substance abuse treatment and feels he is stalling on taking action with this. Pt was tearful as she processed feelings of sadness and uncertainty about the marriage.  Pt agreed to talk with her husband again about making marital counseling part of their work together and Lexicographernotify writer before next session.   Suicidal/Homicidal: No  Therapist Response: Assessed overall level of depression per Pt self report. Processed feelings surrounding separation from husband, explored behaviors Pt would need to see change in her husband for her to try to work on the marriage and discussed the importance of sobriety and couples counseling for success in the marriage. Encouraged dating again before moving back into the home.   Plan: Return again in 2 weeks.  Diagnosis: Axis I: Depression Major Recurrent Moderate      Ingris Pasquarella E, LCSW 07/15/2013

## 2013-07-29 ENCOUNTER — Ambulatory Visit (INDEPENDENT_AMBULATORY_CARE_PROVIDER_SITE_OTHER): Payer: 59 | Admitting: Psychiatry

## 2013-07-29 DIAGNOSIS — F32A Depression, unspecified: Secondary | ICD-10-CM

## 2013-07-29 DIAGNOSIS — Z639 Problem related to primary support group, unspecified: Secondary | ICD-10-CM

## 2013-07-29 DIAGNOSIS — Z638 Other specified problems related to primary support group: Secondary | ICD-10-CM

## 2013-07-29 DIAGNOSIS — F3289 Other specified depressive episodes: Secondary | ICD-10-CM

## 2013-07-29 DIAGNOSIS — F329 Major depressive disorder, single episode, unspecified: Secondary | ICD-10-CM

## 2013-07-29 NOTE — Progress Notes (Signed)
   THERAPIST PROGRESS NOTE  Session Time: 9:00-9:50 am   Participation Level: Active  Behavioral Response: Casual, Neat and Well GroomedAlertDepressed  Type of Therapy: Family Therapy  Treatment Goals addressed: Marital Status and Communication  Interventions: Family Systems  Summary: Nancy Bennett is a 28 y.o. female who presents with her husband Sharia ReeveJosh for a couples session to explore barriers to their marriage being successful. Pt was more outgoing and talkative and her husband more quiet and withdrawn. Pt said they want to work on their marriage and find ways to improve it. Pt said she is still living outside of the home with a friend and both admitted their marriage has improved significantly over the past two weeks with living apart. Pt became tearful when talking about missing being at home and it being more difficult living away from her husband and her husband said he wants her back home. Each processed stressors with the relationship and the other person. Pt indicated that she wants her husband to continue treatment for depression and substance abuse both through counseling and medication management and she wants couples counseling. Pts husband said he wants Pt to be less stressed at home and stop taking her stress out of him and their daughter [name]([name]). He agreed he has depression and to continue getting treatment. Both agreed to couples counseling, the need to learn more effective communication skills to use with each other and a need to have more fun together. Pt requested more help around the house with chores and her husband agreed to explore how to help with that. Both with discuss options for marital counseling and agreed to weekly counseling for the fist month.     Suicidal/Homicidal: No  Therapist Response: Introduced self to Pts husband and introduced the purpose of the session to explore barriers to marital dissatisfaction and create an action plan to work on improving the  marriage, processes sed stressors by each and explored solutions for better communication and marital counseling options going forward. Writer agreed to contact Pt with a therapist at [facility] who specializes in couples counseling, encouraged both to continue current living arrangements until counseling is started.   Plan: Return again in 2 weeks. Refer Pt to couples counseling at [facility]  Diagnosis: Axis I: Depression Major Recurrent Moderate       Jacqui Headen E, LCSW 07/29/2013

## 2013-08-08 ENCOUNTER — Ambulatory Visit (INDEPENDENT_AMBULATORY_CARE_PROVIDER_SITE_OTHER): Payer: 59 | Admitting: Psychiatry

## 2013-08-08 DIAGNOSIS — Z638 Other specified problems related to primary support group: Secondary | ICD-10-CM

## 2013-08-08 DIAGNOSIS — F3289 Other specified depressive episodes: Secondary | ICD-10-CM

## 2013-08-08 DIAGNOSIS — F32A Depression, unspecified: Secondary | ICD-10-CM

## 2013-08-08 DIAGNOSIS — Z639 Problem related to primary support group, unspecified: Secondary | ICD-10-CM

## 2013-08-08 DIAGNOSIS — F329 Major depressive disorder, single episode, unspecified: Secondary | ICD-10-CM

## 2013-08-09 NOTE — Progress Notes (Signed)
   THERAPIST PROGRESS NOTE  Session Time: 4:00-4:50 pm   Participation Level: Active  Behavioral Response: CasualAlertDepressed  Type of Therapy: Individual Therapy  Treatment Goals addressed: Marital Stress and Depression  Interventions: CBT, Solution Focused and Supportive  Summary: Nancy Bennett is a 28 y.o. female who presents with depressed mood and tearful affect. Pt processed sadness and frustration over her strained relationship with her husband and described how over the past two weeks he went "back to his old ways". Pt said he is not being helpful around the house and is showing minimal interest in the relationship. Pt is unsure what the future of the relationship holds.    Suicidal/Homicidal: No   Therapist Response: Assessed overall level of depression and provided supportive counseling   Plan: Return again in 1 weeks.  Diagnosis: Axis I: Depression Major Recurrent Moderate        Ziyonna Christner E, LCSW 08/09/2013

## 2013-08-12 ENCOUNTER — Ambulatory Visit (INDEPENDENT_AMBULATORY_CARE_PROVIDER_SITE_OTHER): Payer: 59 | Admitting: Psychiatry

## 2013-08-12 DIAGNOSIS — F3289 Other specified depressive episodes: Secondary | ICD-10-CM

## 2013-08-12 DIAGNOSIS — F329 Major depressive disorder, single episode, unspecified: Secondary | ICD-10-CM

## 2013-08-12 DIAGNOSIS — F32A Depression, unspecified: Secondary | ICD-10-CM

## 2013-08-12 NOTE — Progress Notes (Signed)
   THERAPIST PROGRESS NOTE  Session Time: 8:00-8:50 am   Participation Level: Active  Behavioral Response: CasualAlertDepressed  Type of Therapy: Individual Therapy  Treatment Goals addressed: Depression and Marital Stress  Interventions: CBT, Solution Focused and Supportive  Summary: Nancy Bennett is a 28 y.o. female who presents with moderate depressed mood and tearful affect. Pt said she is still struggling in her marriage to her husband and he had not done anything to work on the marriage over the past week. Pt expressed sadness, helplessness and concern of the fear of the marriage ending. Pt is aware that she is the stronger personality in the relationship who is pursuing him more than he is pursing her. Pt struggled at the thought of giving him some more space to have the opportunity to pursue her. Pt processed the lack of the control that would be for her. Pt said she would consider it. Pt is still looking forward to couples counseling and is awaiting her appointment with [facility] at the end of this month.  Pt is still living outside of the home while the marriage is being worked on.   Suicidal/Homicidal: No   Therapist Response: Assessed overall level of functioning per Pt self report. Reviewed depression symptoms and provided marital counseling.   Plan: Return again in 1 weeks.  Diagnosis: Axis I: Depression major recurrent Moderate        Trayvond Viets E, LCSW 08/12/2013

## 2013-08-27 ENCOUNTER — Ambulatory Visit (HOSPITAL_COMMUNITY): Payer: Self-pay | Admitting: Psychiatry

## 2015-06-10 ENCOUNTER — Inpatient Hospital Stay (HOSPITAL_COMMUNITY)
Admission: AD | Admit: 2015-06-10 | Discharge: 2015-06-13 | DRG: 766 | Disposition: A | Discharge status: Home / Self Care | Payer: 59 | Source: Ambulatory Visit | Attending: Obstetrics and Gynecology | Admitting: Obstetrics and Gynecology

## 2015-06-10 ENCOUNTER — Encounter (HOSPITAL_COMMUNITY): Payer: Self-pay

## 2015-06-10 DIAGNOSIS — Z883 Allergy status to other anti-infective agents status: Secondary | ICD-10-CM

## 2015-06-10 DIAGNOSIS — O321XX0 Maternal care for breech presentation, not applicable or unspecified: Principal | ICD-10-CM | POA: Diagnosis present

## 2015-06-10 DIAGNOSIS — Z3A37 37 weeks gestation of pregnancy: Secondary | ICD-10-CM

## 2015-06-10 DIAGNOSIS — O4292 Full-term premature rupture of membranes, unspecified as to length of time between rupture and onset of labor: Secondary | ICD-10-CM | POA: Diagnosis present

## 2015-06-10 HISTORY — DX: Major depressive disorder, single episode, unspecified: F32.9

## 2015-06-10 HISTORY — DX: Depression, unspecified: F32.A

## 2015-06-10 LAB — CBC
HCT: 39.1 % (ref 36.0–46.0)
HEMOGLOBIN: 13.4 g/dL (ref 12.0–15.0)
MCH: 29.2 pg (ref 26.0–34.0)
MCHC: 34.3 g/dL (ref 30.0–36.0)
MCV: 85.2 fL (ref 78.0–100.0)
Platelets: 268 10*3/uL (ref 150–400)
RBC: 4.59 MIL/uL (ref 3.87–5.11)
RDW: 13.6 % (ref 11.5–15.5)
WBC: 14 10*3/uL — ABNORMAL HIGH (ref 4.0–10.5)

## 2015-06-10 LAB — POCT FERN TEST: POCT FERN TEST: POSITIVE

## 2015-06-10 MED ORDER — BUPIVACAINE HCL (PF) 0.25 % IJ SOLN
INTRAMUSCULAR | Status: AC
Start: 2015-06-10 — End: 2015-06-10
  Filled 2015-06-10: qty 30

## 2015-06-10 MED ORDER — GENTAMICIN SULFATE 40 MG/ML IJ SOLN
Freq: Once | INTRAVENOUS | Status: AC
  Administered 2015-06-10: 23:00:00 via INTRAVENOUS
  Filled 2015-06-10: qty 9.5

## 2015-06-10 MED ORDER — CLINDAMYCIN PHOSPHATE 900 MG/50ML IV SOLN: 900.0000 mg | Freq: Once | INTRAVENOUS | Status: DC

## 2015-06-10 MED ORDER — FAMOTIDINE IN NACL 20-0.9 MG/50ML-% IV SOLN
20.0000 mg | Freq: Once | INTRAVENOUS | Status: AC
Start: 2015-06-10 — End: 2015-06-11
  Administered 2015-06-11: 20 mg via INTRAVENOUS
  Filled 2015-06-10: qty 50

## 2015-06-10 MED ORDER — CITRIC ACID-SODIUM CITRATE 334-500 MG/5ML PO SOLN
30.0000 mL | Freq: Once | ORAL | Status: AC
  Administered 2015-06-11: 30 mL via ORAL
  Filled 2015-06-10: qty 15

## 2015-06-10 MED ORDER — LACTATED RINGERS IV SOLN
INTRAVENOUS | Status: DC
  Administered 2015-06-10: 125 mL/h via INTRAVENOUS
  Administered 2015-06-11: 01:00:00 via INTRAVENOUS

## 2015-06-10 MED ORDER — GENTAMICIN SULFATE 40 MG/ML IJ SOLN: 5.0000 mg/kg | Freq: Once | INTRAVENOUS | Status: DC

## 2015-06-10 NOTE — H&P (Signed)
Mervyn Gaylizabeth R Buskirk is a 30 y.o. female presenting for leaking fluid  30 yo G1P0 @ 37+0 presents for leaking fluid. The patient states that her water broke around 8 pm this evening. Initially, she was not feeling contractions but over the last hour she has started to get more uncomfortable. Pt had an us in the office this week which showed frank breech presentation. Bedside US confirms frank breech. Additionally, the patient was noted to have 2 elevated blood pressures 150s/90-100s. Pre-e labs are pending History OB History    Gravida Para Term Preterm AB TAB SAB Ectopic Multiple Living   1              Past Medical History  Diagnosis Date  . Anxiety    History reviewed. No pertinent past surgical history. Family History: family history is not on file. Social History:  reports that she has never smoked. She has never used smokeless tobacco. She reports that she drinks about 1.2 oz of alcohol per week. She reports that she does not use illicit drugs.   Prenatal Transfer Tool  Maternal Diabetes: No Genetic Screening: Normal Maternal Ultrasounds/Referrals: Normal Fetal Ultrasounds or other Referrals:  None Maternal Substance Abuse:  No Significant Maternal Medications:  None Significant Maternal Lab Results:  None Other Comments:  None  ROS: as above  Dilation: 1.5 Effacement (%): 80 Exam by:: Orinda KennerKimberly Whitehurst Boyd RN Blood pressure 157/97, pulse 84, temperature 97.9 F (36.6 C), temperature source Oral, resp. rate 18, height 5\' 5"  (1.651 m), weight 101.969 kg (224 lb 12.8 oz), SpO2 98 %. Exam Physical Exam  Prenatal labs: ABO, Rh:  O pos Antibody:  Neg Rubella:  Immune RPR:   NR HBsAg:   Neg HIV:   NR GBS:   Neg  Assessment/Plan: 1) Admit 2) Breech presentation confirmed by bedside US 3) Pt allergic to cephalosporins. Clinda & gent for surgical prophylaxis 4) SCDs on call to OR 5) Consent for primary cesarean section for frank breech presentation  Ojani Berenson  H. 06/10/2015, 11:05 PM

## 2015-06-10 NOTE — MAU Note (Signed)
Pt states water broke at 2030-clear. Having contractions 8-10. Pt states baby was frank breech last Thursday in the office. +FM

## 2015-06-11 ENCOUNTER — Encounter (HOSPITAL_COMMUNITY): Payer: Self-pay | Admitting: *Deleted

## 2015-06-11 ENCOUNTER — Inpatient Hospital Stay (HOSPITAL_COMMUNITY): Payer: 59 | Admitting: Anesthesiology

## 2015-06-11 ENCOUNTER — Encounter (HOSPITAL_COMMUNITY)
Admission: AD | Disposition: A | Discharge status: Home / Self Care | Payer: Self-pay | Source: Ambulatory Visit | Attending: Obstetrics and Gynecology

## 2015-06-11 DIAGNOSIS — Z883 Allergy status to other anti-infective agents status: Secondary | ICD-10-CM | POA: Diagnosis not present

## 2015-06-11 DIAGNOSIS — Z3A37 37 weeks gestation of pregnancy: Secondary | ICD-10-CM | POA: Diagnosis not present

## 2015-06-11 DIAGNOSIS — O4292 Full-term premature rupture of membranes, unspecified as to length of time between rupture and onset of labor: Secondary | ICD-10-CM | POA: Diagnosis present

## 2015-06-11 DIAGNOSIS — O321XX0 Maternal care for breech presentation, not applicable or unspecified: Secondary | ICD-10-CM | POA: Diagnosis present

## 2015-06-11 LAB — COMPREHENSIVE METABOLIC PANEL
ALK PHOS: 144 U/L — AB (ref 38–126)
ALT: 18 U/L (ref 14–54)
AST: 26 U/L (ref 15–41)
Albumin: 3 g/dL — ABNORMAL LOW (ref 3.5–5.0)
Anion gap: 9 (ref 5–15)
BILIRUBIN TOTAL: 0.5 mg/dL (ref 0.3–1.2)
BUN: 14 mg/dL (ref 6–20)
CHLORIDE: 107 mmol/L (ref 101–111)
CO2: 22 mmol/L (ref 22–32)
CREATININE: 0.85 mg/dL (ref 0.44–1.00)
Calcium: 8.8 mg/dL — ABNORMAL LOW (ref 8.9–10.3)
GFR calc Af Amer: >60 mL/min (ref >60)
GLUCOSE: 89 mg/dL (ref 65–99)
POTASSIUM: 4.4 mmol/L (ref 3.5–5.1)
Sodium: 138 mmol/L (ref 135–145)
Total Protein: 7 g/dL (ref 6.5–8.1)

## 2015-06-11 LAB — PROTEIN / CREATININE RATIO, URINE
Creatinine, Urine: 55 mg/dL
PROTEIN CREATININE RATIO: 0.38 mg/mg{creat} — AB (ref 0.00–0.15)
TOTAL PROTEIN, URINE: 21 mg/dL

## 2015-06-11 LAB — LACTATE DEHYDROGENASE: LDH: 164 U/L (ref 98–192)

## 2015-06-11 LAB — TYPE AND SCREEN
ABO/RH(D): O POS
Antibody Screen: NEGATIVE

## 2015-06-11 LAB — URIC ACID: Uric Acid, Serum: 6.4 mg/dL (ref 2.3–6.6)

## 2015-06-11 LAB — ABO/RH: ABO/RH(D): O POS

## 2015-06-11 SURGERY — Surgical Case
Anesthesia: Spinal

## 2015-06-11 MED ORDER — ZOLPIDEM TARTRATE 5 MG PO TABS: 5.0000 mg | ORAL_TABLET | Freq: Every evening | ORAL | Status: DC | PRN

## 2015-06-11 MED ORDER — SIMETHICONE 80 MG PO CHEW
80.0000 mg | CHEWABLE_TABLET | Freq: Three times a day (TID) | ORAL | Status: DC
  Administered 2015-06-11 – 2015-06-13 (×8): 80 mg via ORAL
  Filled 2015-06-11 (×7): qty 1

## 2015-06-11 MED ORDER — LABETALOL HCL 5 MG/ML IV SOLN
INTRAVENOUS | Status: AC
  Administered 2015-06-11: 20 mg via INTRAVENOUS
  Filled 2015-06-11: qty 4

## 2015-06-11 MED ORDER — ESCITALOPRAM OXALATE 10 MG PO TABS
10.0000 mg | ORAL_TABLET | Freq: Every day | ORAL | Status: DC
Start: 2015-06-11 — End: 2015-06-13
  Filled 2015-06-11 (×4): qty 1

## 2015-06-11 MED ORDER — LANOLIN HYDROUS EX OINT: 1.0000 "application " | TOPICAL_OINTMENT | CUTANEOUS | Status: DC | PRN

## 2015-06-11 MED ORDER — DEXAMETHASONE SODIUM PHOSPHATE 4 MG/ML IJ SOLN
INTRAMUSCULAR | Status: DC | PRN
  Administered 2015-06-11: 4 mg via INTRAVENOUS

## 2015-06-11 MED ORDER — LACTATED RINGERS IV SOLN
INTRAVENOUS | Status: DC
  Administered 2015-06-11: 10:00:00 via INTRAVENOUS

## 2015-06-11 MED ORDER — FENTANYL CITRATE (PF) 100 MCG/2ML IJ SOLN
INTRAMUSCULAR | Status: AC
  Filled 2015-06-11: qty 2

## 2015-06-11 MED ORDER — SCOPOLAMINE 1 MG/3DAYS TD PT72
MEDICATED_PATCH | TRANSDERMAL | Status: AC
  Filled 2015-06-11: qty 1

## 2015-06-11 MED ORDER — NALBUPHINE HCL 10 MG/ML IJ SOLN: 5.0000 mg | Freq: Once | INTRAMUSCULAR | Status: DC | PRN

## 2015-06-11 MED ORDER — PHENYLEPHRINE 8 MG IN D5W 100 ML (0.08MG/ML) PREMIX OPTIME
INJECTION | INTRAVENOUS | Status: AC
  Filled 2015-06-11: qty 100

## 2015-06-11 MED ORDER — NALBUPHINE HCL 10 MG/ML IJ SOLN: 5.0000 mg | INTRAMUSCULAR | Status: DC | PRN

## 2015-06-11 MED ORDER — MEPERIDINE HCL 25 MG/ML IJ SOLN
INTRAMUSCULAR | Status: DC | PRN
  Administered 2015-06-11 (×2): 12.5 mg via INTRAVENOUS

## 2015-06-11 MED ORDER — DIBUCAINE 1 % RE OINT: 1.0000 "application " | TOPICAL_OINTMENT | RECTAL | Status: DC | PRN

## 2015-06-11 MED ORDER — OXYCODONE-ACETAMINOPHEN 5-325 MG PO TABS
1.0000 | ORAL_TABLET | ORAL | Status: DC | PRN
  Administered 2015-06-11 – 2015-06-13 (×5): 1 via ORAL
  Filled 2015-06-11 (×5): qty 1

## 2015-06-11 MED ORDER — ACETAMINOPHEN 325 MG PO TABS: 650.0000 mg | ORAL_TABLET | ORAL | Status: DC | PRN

## 2015-06-11 MED ORDER — MENTHOL 3 MG MT LOZG: 1.0000 | LOZENGE | OROMUCOSAL | Status: DC | PRN

## 2015-06-11 MED ORDER — SCOPOLAMINE 1 MG/3DAYS TD PT72
1.0000 | MEDICATED_PATCH | Freq: Once | TRANSDERMAL | Status: DC
  Filled 2015-06-11: qty 1

## 2015-06-11 MED ORDER — ONDANSETRON HCL 4 MG/2ML IJ SOLN
INTRAMUSCULAR | Status: AC
  Filled 2015-06-11: qty 2

## 2015-06-11 MED ORDER — TETANUS-DIPHTH-ACELL PERTUSSIS 5-2.5-18.5 LF-MCG/0.5 IM SUSP: 0.5000 mL | Freq: Once | INTRAMUSCULAR | Status: DC

## 2015-06-11 MED ORDER — SIMETHICONE 80 MG PO CHEW: 80.0000 mg | CHEWABLE_TABLET | ORAL | Status: DC | PRN

## 2015-06-11 MED ORDER — SIMETHICONE 80 MG PO CHEW
80.0000 mg | CHEWABLE_TABLET | ORAL | Status: DC
Start: 2015-06-12 — End: 2015-06-13
  Administered 2015-06-12: 80 mg via ORAL
  Filled 2015-06-11 (×2): qty 1

## 2015-06-11 MED ORDER — MORPHINE SULFATE (PF) 0.5 MG/ML IJ SOLN
INTRAMUSCULAR | Status: DC | PRN
  Administered 2015-06-11: .2 mg via INTRATHECAL

## 2015-06-11 MED ORDER — KETOROLAC TROMETHAMINE 30 MG/ML IJ SOLN
30.0000 mg | Freq: Four times a day (QID) | INTRAMUSCULAR | Status: AC | PRN
  Administered 2015-06-11: 30 mg via INTRAVENOUS

## 2015-06-11 MED ORDER — MEPERIDINE HCL 25 MG/ML IJ SOLN: 6.2500 mg | INTRAMUSCULAR | Status: DC | PRN

## 2015-06-11 MED ORDER — DEXAMETHASONE SODIUM PHOSPHATE 4 MG/ML IJ SOLN
INTRAMUSCULAR | Status: AC
  Filled 2015-06-11: qty 1

## 2015-06-11 MED ORDER — SENNOSIDES-DOCUSATE SODIUM 8.6-50 MG PO TABS
2.0000 | ORAL_TABLET | ORAL | Status: DC
  Administered 2015-06-12 (×2): 2 via ORAL
  Filled 2015-06-11 (×2): qty 2

## 2015-06-11 MED ORDER — METHYLERGONOVINE MALEATE 0.2 MG/ML IJ SOLN
0.2000 mg | INTRAMUSCULAR | Status: DC | PRN
Start: 2015-06-11 — End: 2015-06-13

## 2015-06-11 MED ORDER — NALOXONE HCL 0.4 MG/ML IJ SOLN: 0.4000 mg | INTRAMUSCULAR | Status: DC | PRN

## 2015-06-11 MED ORDER — DIPHENHYDRAMINE HCL 50 MG/ML IJ SOLN
12.5000 mg | INTRAMUSCULAR | Status: DC | PRN
Start: 2015-06-11 — End: 2015-06-13

## 2015-06-11 MED ORDER — MORPHINE SULFATE (PF) 0.5 MG/ML IJ SOLN
INTRAMUSCULAR | Status: AC
  Filled 2015-06-11: qty 10

## 2015-06-11 MED ORDER — BUPIVACAINE IN DEXTROSE 0.75-8.25 % IT SOLN
INTRATHECAL | Status: DC | PRN
  Administered 2015-06-11: 1.5 mL via INTRATHECAL

## 2015-06-11 MED ORDER — FENTANYL CITRATE (PF) 100 MCG/2ML IJ SOLN
INTRAMUSCULAR | Status: DC | PRN
  Administered 2015-06-11: 10 ug via INTRATHECAL

## 2015-06-11 MED ORDER — DIPHENHYDRAMINE HCL 25 MG PO CAPS: 25.0000 mg | ORAL_CAPSULE | ORAL | Status: DC | PRN

## 2015-06-11 MED ORDER — IBUPROFEN 600 MG PO TABS
600.0000 mg | ORAL_TABLET | Freq: Four times a day (QID) | ORAL | Status: DC
  Administered 2015-06-11 – 2015-06-13 (×9): 600 mg via ORAL
  Filled 2015-06-11 (×9): qty 1

## 2015-06-11 MED ORDER — FENTANYL CITRATE (PF) 100 MCG/2ML IJ SOLN: 25.0000 ug | INTRAMUSCULAR | Status: DC | PRN

## 2015-06-11 MED ORDER — DIPHENHYDRAMINE HCL 25 MG PO CAPS: 25.0000 mg | ORAL_CAPSULE | Freq: Four times a day (QID) | ORAL | Status: DC | PRN

## 2015-06-11 MED ORDER — LACTATED RINGERS IV SOLN
2.5000 [IU]/h | INTRAVENOUS | Status: AC
Start: 2015-06-11 — End: 2015-06-11

## 2015-06-11 MED ORDER — ONDANSETRON HCL 4 MG/2ML IJ SOLN: 4.0000 mg | Freq: Three times a day (TID) | INTRAMUSCULAR | Status: DC | PRN

## 2015-06-11 MED ORDER — SODIUM CHLORIDE 0.9% FLUSH: 3.0000 mL | INTRAVENOUS | Status: DC | PRN

## 2015-06-11 MED ORDER — KETOROLAC TROMETHAMINE 30 MG/ML IJ SOLN: 30.0000 mg | Freq: Four times a day (QID) | INTRAMUSCULAR | Status: AC | PRN

## 2015-06-11 MED ORDER — SCOPOLAMINE 1 MG/3DAYS TD PT72
MEDICATED_PATCH | TRANSDERMAL | Status: DC | PRN
  Administered 2015-06-11: 1 via TRANSDERMAL

## 2015-06-11 MED ORDER — ONDANSETRON HCL 4 MG/2ML IJ SOLN
INTRAMUSCULAR | Status: DC | PRN
  Administered 2015-06-11: 4 mg via INTRAVENOUS

## 2015-06-11 MED ORDER — MEPERIDINE HCL 25 MG/ML IJ SOLN
INTRAMUSCULAR | Status: AC
  Filled 2015-06-11: qty 1

## 2015-06-11 MED ORDER — DEXTROSE 5 % IV SOLN
1.0000 ug/kg/h | INTRAVENOUS | Status: DC | PRN
  Filled 2015-06-11: qty 2

## 2015-06-11 MED ORDER — KETOROLAC TROMETHAMINE 30 MG/ML IJ SOLN
INTRAMUSCULAR | Status: AC
  Filled 2015-06-11: qty 1

## 2015-06-11 MED ORDER — PRENATAL MULTIVITAMIN CH
1.0000 | ORAL_TABLET | Freq: Every day | ORAL | Status: DC
  Administered 2015-06-11 – 2015-06-13 (×3): 1 via ORAL
  Filled 2015-06-11 (×3): qty 1

## 2015-06-11 MED ORDER — PROMETHAZINE HCL 25 MG/ML IJ SOLN: 6.2500 mg | INTRAMUSCULAR | Status: DC | PRN

## 2015-06-11 MED ORDER — WITCH HAZEL-GLYCERIN EX PADS: 1.0000 "application " | MEDICATED_PAD | CUTANEOUS | Status: DC | PRN

## 2015-06-11 MED ORDER — OXYTOCIN 10 UNIT/ML IJ SOLN
40.0000 [IU] | INTRAVENOUS | Status: DC | PRN
  Administered 2015-06-11: 40 [IU] via INTRAVENOUS

## 2015-06-11 MED ORDER — METHYLERGONOVINE MALEATE 0.2 MG PO TABS
0.2000 mg | ORAL_TABLET | ORAL | Status: DC | PRN
Start: 2015-06-11 — End: 2015-06-13

## 2015-06-11 MED ORDER — LABETALOL HCL 5 MG/ML IV SOLN
20.0000 mg | Freq: Once | INTRAVENOUS | Status: AC
  Administered 2015-06-11: 20 mg via INTRAVENOUS

## 2015-06-11 MED ORDER — LACTATED RINGERS IV SOLN: INTRAVENOUS | Status: DC

## 2015-06-11 MED ORDER — OXYTOCIN 10 UNIT/ML IJ SOLN
INTRAMUSCULAR | Status: AC
  Filled 2015-06-11: qty 4

## 2015-06-11 MED ORDER — 0.9 % SODIUM CHLORIDE (POUR BTL) OPTIME
TOPICAL | Status: DC | PRN
  Administered 2015-06-11: 1000 mL

## 2015-06-11 SURGICAL SUPPLY — 30 items
CLAMP CORD UMBIL (MISCELLANEOUS) IMPLANT
CLOTH BEACON ORANGE TIMEOUT ST (SAFETY) ×3 IMPLANT
DRSG OPSITE POSTOP 4X10 (GAUZE/BANDAGES/DRESSINGS) ×3 IMPLANT
DURAPREP 26ML APPLICATOR (WOUND CARE) ×3 IMPLANT
ELECT REM PT RETURN 9FT ADLT (ELECTROSURGICAL) ×3
ELECTRODE REM PT RTRN 9FT ADLT (ELECTROSURGICAL) ×1 IMPLANT
EXTRACTOR VACUUM M CUP 4 TUBE (SUCTIONS) IMPLANT
EXTRACTOR VACUUM M CUP 4' TUBE (SUCTIONS)
GLOVE BIO SURGEON STRL SZ7 (GLOVE) ×3 IMPLANT
GLOVE BIOGEL PI IND STRL 7.0 (GLOVE) ×1 IMPLANT
GLOVE BIOGEL PI INDICATOR 7.0 (GLOVE) ×2
GOWN STRL REUS W/TWL LRG LVL3 (GOWN DISPOSABLE) ×6 IMPLANT
KIT ABG SYR 3ML LUER SLIP (SYRINGE) IMPLANT
LIQUID BAND (GAUZE/BANDAGES/DRESSINGS) ×3 IMPLANT
NEEDLE HYPO 22GX1.5 SAFETY (NEEDLE) IMPLANT
NEEDLE HYPO 25X5/8 SAFETYGLIDE (NEEDLE) IMPLANT
NS IRRIG 1000ML POUR BTL (IV SOLUTION) ×3 IMPLANT
PACK C SECTION WH (CUSTOM PROCEDURE TRAY) ×3 IMPLANT
PAD OB MATERNITY 4.3X12.25 (PERSONAL CARE ITEMS) ×3 IMPLANT
PENCIL SMOKE EVAC W/HOLSTER (ELECTROSURGICAL) ×3 IMPLANT
RTRCTR C-SECT PINK 25CM LRG (MISCELLANEOUS) ×3 IMPLANT
SUT CHROMIC 1 CTX 36 (SUTURE) ×6 IMPLANT
SUT CHROMIC 2 0 CT 1 (SUTURE) ×3 IMPLANT
SUT PDS AB 0 CTX 60 (SUTURE) ×3 IMPLANT
SUT VIC AB 2-0 CT1 27 (SUTURE) ×4
SUT VIC AB 2-0 CT1 TAPERPNT 27 (SUTURE) ×2 IMPLANT
SUT VIC AB 4-0 KS 27 (SUTURE) ×9 IMPLANT
SYR 30ML LL (SYRINGE) IMPLANT
TOWEL OR 17X24 6PK STRL BLUE (TOWEL DISPOSABLE) ×3 IMPLANT
TRAY FOLEY CATH SILVER 14FR (SET/KITS/TRAYS/PACK) ×3 IMPLANT

## 2015-06-11 NOTE — Anesthesia Procedure Notes (Signed)
Spinal Patient location during procedure: OR Start time: 06/11/2015 12:47 AM End time: 06/11/2015 12:49 AM Staffing Anesthesiologist: Suella Broad D Performed by: anesthesiologist  Preanesthetic Checklist Completed: patient identified, site marked, surgical consent, pre-op evaluation, timeout performed, IV checked, risks and benefits discussed and monitors and equipment checked Spinal Block Patient position: sitting Prep: Betadine Patient monitoring: heart rate, continuous pulse ox, blood pressure and cardiac monitor Approach: midline Location: L4-5 Injection technique: single-shot Needle Needle type: Whitacre and Introducer  Needle gauge: 24 G Needle length: 9 cm Additional Notes Negative paresthesia. Negative blood return. Positive free-flowing CSF. Expiration date of kit checked and confirmed. Patient tolerated procedure well, without complications.

## 2015-06-11 NOTE — Op Note (Signed)
Pre-Operative Diagnosis: 1) 37+0 week intrauterine pregnancy 2) Spontaneous rupture of membranes 3) Frank Breech Presentation 4) Labor Postoperative Diagnosis: 1) 37+0 week intrauterine pregnancy 2) Spontaneous rupture of membranes 3) Nancy FellersFrank Breech Presentation 4) Labor Procedure: Primary Low transverse cesarean section Surgeon: Dr. Waynard ReedsKendra Anuar Walgren Assistant: None  Operative Findings: Vigorous female infant in the vertex presentation with apgar scores of 8 & 9. Normal appearing ovaries and tubes.  Specimen: Placenta to L&D for disposal EBL: Total I/O In: 600 [I.V.:600] Out: 650 [Urine:150; Blood:500]   Procedure:[name] is an 30 year old gravida 1 para 0 at 37 weeks and 0 days estimated gestational age who presents for cesarean section. Following the appropriate informed consent the patient was brought to the operating room where spinal anesthesia was administered and found to be adequate. She was placed in the dorsal supine position with a leftward tilt. She was prepped and draped in the normal sterile fashion. Scalpel was then used to make a Pfannenstiel skin incision which was carried down to the underlying layers of soft tissue to the fascia. The fascia was incised in the midline and the fascial incision was extended laterally with Mayo scissors. The superior aspect of the fascial incision was grasped with Coker clamps x2, tented up and the rectus muscles dissected off sharply with the electrocautery unit area and the same procedure was repeated on the inferior aspect of the fascial incision. The rectus muscles were separated in the midline. The abdominal peritoneum was identified, tented up, entered sharply, and the incision was extended superiorly and inferiorly with good visualization of the bladder. The Alexis retractor was then deployed. The vesicouterine peritoneum was identified, tented up, entered sharply, and the bladder flap was created digitally. Scalpel was then used to make a low transverse  incision on the uterus which was extended laterally with blunt dissection. The fetal breech was identified, delivered easily through the uterine incision followed by head. The infant was bulb suctioned on the operative field cried vigorously, cord was clamped and cut and the infant was passed to the waiting neonatologist. Placenta was then delivered spontaneously, the uterus was cleared of all clot and debris. The uterine incision was repaired with #1 chromic in running locked fashion. Ovaries and tubes were inspected and normal. The Alexis retractor was removed. The abdominal peritoneum was reapproximated with 2-0 Vicryl in a running fashion, the rectus muscles was reapproximated with #1 chromic in a running fashion. The fascia was closed with a looped PDS in a running fashion. The skin was closed with 4-0 vicryl in a subcuticular fashion and Surgical skin glue. All sponge lap and needle counts were correct x2. Patient tolerated the procedure well and recovered in stable condition following the procedure.

## 2015-06-11 NOTE — Consult Note (Signed)
Neonatology Note:   Attendance at C-section:    I was asked by Dr. Tenny Crawoss to attend this C/S at term for labor with breech presentation. The mother is a G1, GBS negative. ROM 5hrs before delivery, fluid clear. Infant vigorous with good spontaneous cry and tone. Needed only minimal bulb suctioning. Ap 8/9. Lungs clear to ausc in DR. To CN to care of Pediatrician.  Jamie Brookesavid Ehrmann, MD

## 2015-06-11 NOTE — Anesthesia Postprocedure Evaluation (Signed)
Anesthesia Post Note  Patient: Nancy Bennett  Procedure(s) Performed: Procedure(s) (LRB): CESAREAN SECTION (N/A)  Patient location during evaluation: Mother Baby Anesthesia Type: Spinal Level of consciousness: awake and alert Pain management: pain level controlled Vital Signs Assessment: post-procedure vital signs reviewed and stable Respiratory status: spontaneous breathing Cardiovascular status: stable Postop Assessment: no headache, no backache, spinal receding and patient able to bend at knees Anesthetic complications: no    Last Vitals:  Filed Vitals:   06/11/15 0500 06/11/15 0600  BP: 149/91 135/93  Pulse: 86 93  Temp: 36.8 C 36.6 C  Resp: 18 18    Last Pain:  Filed Vitals:   06/11/15 0605  PainSc: 0-No pain                 Edison PaceWILKERSON,Vannak Montenegro

## 2015-06-11 NOTE — Anesthesia Preprocedure Evaluation (Signed)
Anesthesia Evaluation  Patient identified by MRN, date of birth, ID band Patient awake    Reviewed: Allergy & Precautions, NPO status , Patient's Chart, lab work & pertinent test results  Airway Mallampati: II  TM Distance: >3 FB Neck ROM: Full    Dental  (+) Teeth Intact   Pulmonary    breath sounds clear to auscultation       Cardiovascular negative cardio ROS   Rhythm:Regular Rate:Normal     Neuro/Psych PSYCHIATRIC DISORDERS Anxiety Depression    GI/Hepatic negative GI ROS, Neg liver ROS,   Endo/Other  negative endocrine ROS  Renal/GU negative Renal ROS  negative genitourinary   Musculoskeletal negative musculoskeletal ROS (+)   Abdominal   Peds negative pediatric ROS (+)  Hematology negative hematology ROS (+)   Anesthesia Other Findings   Reproductive/Obstetrics (+) Pregnancy                             Lab Results  Component Value Date   WBC 14.0* 06/10/2015   HGB 13.4 06/10/2015   HCT 39.1 06/10/2015   MCV 85.2 06/10/2015   PLT 268 06/10/2015   No results found for: INR, PROTIME   Anesthesia Physical Anesthesia Plan  ASA: III  Anesthesia Plan: Spinal   Post-op Pain Management:    Induction:   Airway Management Planned:   Additional Equipment:   Intra-op Plan:   Post-operative Plan:   Informed Consent: I have reviewed the patients History and Physical, chart, labs and discussed the procedure including the risks, benefits and alternatives for the proposed anesthesia with the patient or authorized representative who has indicated his/her understanding and acceptance.     Plan Discussed with: CRNA  Anesthesia Plan Comments:         Anesthesia Quick Evaluation

## 2015-06-11 NOTE — Progress Notes (Signed)
Patient is doing well.  She is tolerating PO, post op nausea has resolved.  Foley catheter not yet removed.  Pain is controlled.  Lochia is appropriate  Filed Vitals:   06/11/15 0400 06/11/15 0500 06/11/15 0600 06/11/15 0808  BP: 138/88 149/91 135/93 135/95  Pulse: 78 86 93 99  Temp: 98 F (36.7 C) 98.3 F (36.8 C) 97.9 F (36.6 C) 98.5 F (36.9 C)  TempSrc: Oral Oral Axillary Axillary  Resp: 18 18 18 16   Height:      Weight:      SpO2: 97% 95% 97% 98%    NAD Abdomen:  soft, appropriate tenderness, incisions intact and without erythema or drainage ext:    Symmetric, 1+ edema bilaterally  Lab Results  Component Value Date   WBC 14.0* 06/10/2015   HGB 13.4 06/10/2015   HCT 39.1 06/10/2015   MCV 85.2 06/10/2015   PLT 268 06/10/2015    --/--/O POS, O POS (03/08 2253)/RImmune  A/P    30 y.o. G1P1001 POD #0 s/p primary c/s for malpresentation Routine post op and postpartum care.   Remove foley this AM, ambulate

## 2015-06-11 NOTE — Lactation Note (Signed)
This note was copied from a baby's chart. Lactation Consultation Note  Patient Name: Nancy Bennett ZOXWR'UToday's Date: 06/11/2015 Reason for consult: Initial assessment Visited with Mom, baby 10 hrs old.  Baby has latched 3 times, Mom denies discomfort with latching and states she hears swallowing when baby feeding.  Reviewed basics with Mom, and encouraged her to call for assistance with latching.   Mom wanted to try to feed baby while LC visiting.  Baby wrapped up in crib.  Baby placed skin to skin in football hold.  Tried to awaken baby to root and latch to breast.  Manual expression demonstrated and Mom has copious amounts of colostrum.  Milk expressed into baby's mouth, but he wouldn't latch at this time.  Reassured Mom that this was WNL.  Baby 37.1 weeks, so discussed importance of watching baby for sleepiness.  Talked about possible need to express her milk and spoon feed some colostrum if baby remains sleepy today.  Baby placed skin to skin on Mom's chest. Brochure given to Mom, with explanation on IP and OP lactation services available to her.  To follow up in am and PRN.   Consult Status Consult Status: Follow-up Date: 06/12/15 Follow-up type: In-patient    Judee ClaraSmith, Britiny Defrain E 06/11/2015, 11:47 AM

## 2015-06-11 NOTE — Transfer of Care (Signed)
Immediate Anesthesia Transfer of Care Note  Patient: Nancy Bennett  Procedure(s) Performed: Procedure(s): CESAREAN SECTION (N/A)  Patient Location: PACU  Anesthesia Type:Spinal  Level of Consciousness: awake, alert  and oriented  Airway & Oxygen Therapy: Patient Spontanous Breathing  Post-op Assessment: Report given to RN and Post -op Vital signs reviewed and stable  Post vital signs: Reviewed and stable  Last Vitals:  Filed Vitals:   06/11/15 0020 06/11/15 0021  BP: 142/93 142/93  Pulse: 84 84  Temp:    Resp:  18    Complications: No apparent anesthesia complications

## 2015-06-12 LAB — CBC
HCT: 32.9 % — ABNORMAL LOW (ref 36.0–46.0)
HEMOGLOBIN: 11 g/dL — AB (ref 12.0–15.0)
MCH: 28.6 pg (ref 26.0–34.0)
MCHC: 33.4 g/dL (ref 30.0–36.0)
MCV: 85.7 fL (ref 78.0–100.0)
Platelets: 237 10*3/uL (ref 150–400)
RBC: 3.84 MIL/uL — ABNORMAL LOW (ref 3.87–5.11)
RDW: 13.9 % (ref 11.5–15.5)
WBC: 15.8 10*3/uL — ABNORMAL HIGH (ref 4.0–10.5)

## 2015-06-12 LAB — RPR: RPR: NONREACTIVE

## 2015-06-12 NOTE — Progress Notes (Signed)
Patient is eating, ambulating, voiding.  Pain control is good.  Appropriate lochia, no complaints.  Filed Vitals:   06/11/15 2030 06/12/15 0010 06/12/15 0615 06/12/15 1713  BP: 138/88 126/78 133/86 135/81  Pulse: 97 91 85 83  Temp: 98.7 F (37.1 C) 98.7 F (37.1 C) 98.2 F (36.8 C) 98.9 F (37.2 C)  TempSrc: Oral Oral  Oral  Resp: 18 18 18 16   Height:      Weight:      SpO2: 97%       Fundus firm Inc: c/d/i Ext: no CT  Lab Results  Component Value Date   WBC 15.8* 06/12/2015   HGB 11.0* 06/12/2015   HCT 32.9* 06/12/2015   MCV 85.7 06/12/2015   PLT 237 06/12/2015    --/--/O POS, O POS (03/08 2253)  A/P Post op day #1 s/p c/s - labor/srom/breech circ desired, consent obtained Doing well  Routine care.  Expect d/c 3/11    JeffersonALLAHAN, [place]

## 2015-06-12 NOTE — Clinical Social Work Maternal (Signed)
CLINICAL SOCIAL WORK MATERNAL/CHILD NOTE  Patient Details  Name: Nancy Bennett MRN: [id number] Date of Birth: 1986/01/04  Date:  06/12/2015  Clinical Social Worker Initiating Note:  Loleta Books MSW, LCSW Date/ Time Initiated:  06/12/15/1020     Child's Name:  Huston Foley   Legal Guardian:  Lanora Manis and [name] Burskirk   Need for Interpreter:  None   Date of Referral:  06/11/15     Reason for Referral:  History of depression and anxiety   Referral Source:  [facility]   Address:  [address] [place], [place] [address]  Phone number:  [phone]   Household Members:  Spouse, stepdaughter   Natural Supports (not living in the home):  Immediate Family, Extended Family   Professional Supports: None   Employment: Full-time   Type of Work: Lexicographer:  IT sales professional Resources:  Media planner   Other Resources:      Cultural/Religious Considerations Which May Impact Care:  None reported  Strengths:  Ability to meet basic needs , Merchandiser, retail , Home prepared for child    Risk Factors/Current Problems:  None   Cognitive State:  Able to Concentrate , Alert , Goal Oriented , Linear Thinking    Mood/Affect:  Euthymic , Comfortable , Calm    CSW Assessment:  CSW received request for consult due to MOB presenting with a history of anxiety and depression.  MOB was alone in the room upon CSW arrival. She was in a pleasant mood, displayed a full range in affect, and was breastfeeding the infant upon CSW arrival.  MOB provided consent for visit, but was not openly expressive.    MOB denied questions or concerns as she transitions postpartum. MOB shared that she had anticipated a need to have a C-section due to the infant's position, but shared that her water broke and the infant was born 3 weeks early.  MOB denied concerns about requiring a C-section since she had an awareness that it may occur. MOB denied  questions or concerns about breastfeeding, and shared that she is "ready" to go home.  MOB stated that she has a step-daughter, and discussed impressions that it not a drastic role adjustment because she has been involved in her step-daugther's life since she was 30 years old.  MOB confirmed that the home is prepared for the infant, and stated that she lives near her mother and her mother-in-law.   CSW inquired about MOB's mental health history. MOB originally stated that she is unsure why everyone keeps asking about her history of depression and anxiety; however, she then confirmed history of depression in 2011, but denied history of anxiety. MOB stated that it was situational, and did not express any interest, desire, or need to discuss in further detail.  MOB shared that she was in therapy and was prescribed Lexapro. MOB stated that she took the medication for 6 months, discontinued the medication, and there have been no additional mental health needs or concerns.  MOB denied belief that depression is currently a presenting problem, and denied any mental health complications during the pregnancy.  MOB maintained eye contact during education about perinatal mood and anxiety, acknowledged her increased risk, and denied additional questions or concerns.    MOB denied questions, concerns, or needs at this time. She acknowledged ongoing availability of CSW, and agreed to contact CSW if needs arise.    CSW Plan/Description:  Patient/Family Education , No Further Intervention Required/No Barriers to Discharge  Pervis HockingVenning, Roshawna Colclasure N, LCSW 06/12/2015, 11:11 AM

## 2015-06-12 NOTE — Lactation Note (Signed)
This note was copied from a baby's chart. Lactation Consultation Note Follow up visit at 40 hours of age.  Mom reports good feedings today.  Baby has had 2 voids and 3 stools in past 24 hours with 9 feedings.  Baby has been sleepy for the last few feedings, but more awake than yesterday per mom.  Encouraged mom to keep baby active for feeding with stimulation and breast massage.  Encouraged mom to feed baby on both breasts if baby will and mom to document amount of time baby is active for feedings.  Assisted with football latch.  Mom is able to hand express drops and several swallows audible.  Baby needs stimulation to maintain feeding.  LC assisted with hand expression of right breast to demonstrate spoon feeding of 1ml EBM.  Encouraged mom to offer spoon feedings to increase calories and stimulate more milk production.  Mom to wake baby for feedings if needed and will call to RN if baby is not doing well for even 1 feeding.  Mom to call for assist as needed.     Patient Name: Boy Irena Reichmannlizabeth Buskirk WUJWJ'XToday's Date: 06/12/2015 Reason for consult: Follow-up assessment   Maternal Data    Feeding Feeding Type: Breast Fed Length of feed:  (observed 10 minutes)  LATCH Score/Interventions Latch: Repeated attempts needed to sustain latch, nipple held in mouth throughout feeding, stimulation needed to elicit sucking reflex. Intervention(s): Skin to skin;Teach feeding cues;Waking techniques Intervention(s): Breast massage;Breast compression  Audible Swallowing: A few with stimulation (several with stimulation) Intervention(s): Skin to skin;Hand expression  Type of Nipple: Everted at rest and after stimulation  Comfort (Breast/Nipple): Soft / non-tender     Hold (Positioning): Assistance needed to correctly position infant at breast and maintain latch. Intervention(s): Breastfeeding basics reviewed;Support Pillows;Position options;Skin to skin  LATCH Score: 7  Lactation Tools Discussed/Used      Consult Status Consult Status: Follow-up Date: 06/13/15 Follow-up type: In-patient    Jannifer RodneyShoptaw, Embrie Mikkelsen Lynn 06/12/2015, 6:05 PM

## 2015-06-13 ENCOUNTER — Encounter (HOSPITAL_COMMUNITY): Payer: Self-pay | Admitting: Obstetrics and Gynecology

## 2015-06-13 MED ORDER — OXYCODONE-ACETAMINOPHEN 5-325 MG PO TABS: 1.0000 | ORAL_TABLET | ORAL | Status: DC | PRN

## 2015-06-13 NOTE — Progress Notes (Signed)
Patient is eating, ambulating, voiding.  Pain control is good.  Appropriate lochia, no complaints.  Filed Vitals:   06/12/15 0010 06/12/15 0615 06/12/15 1713 06/13/15 0700  BP: 126/78 133/86 135/81 137/87  Pulse: 91 85 83 86  Temp: 98.7 F (37.1 C) 98.2 F (36.8 C) 98.9 F (37.2 C) 98.7 F (37.1 C)  TempSrc: Oral  Oral   Resp: 18 18 16 18   Height:      Weight:      SpO2:        Fundus firm Inc: c/d/i Ext: no CT  Lab Results  Component Value Date   WBC 15.8* 06/12/2015   HGB 11.0* 06/12/2015   HCT 32.9* 06/12/2015   MCV 85.7 06/12/2015   PLT 237 06/12/2015    --/--/O POS, O POS (03/08 2253)  A/P Post op day #2 s/p R C/S BPs in normal range and pt without symtpoms  Routine care.  Expect d/c today.    Philip AspenALLAHAN, Janaia Kozel

## 2015-06-13 NOTE — Discharge Summary (Signed)
Obstetric Discharge Summary Reason for Admission: cesarean section, breech Prenatal Procedures: ultrasound Intrapartum Procedures: cesarean: low cervical, transverse Postpartum Procedures: none Complications-Operative and Postpartum: none HEMOGLOBIN  Date Value Ref Range Status  06/12/2015 11.0* 12.0 - 15.0 g/dL Final   HCT  Date Value Ref Range Status  06/12/2015 32.9* 36.0 - 46.0 % Final    Physical Exam:  General: alert and cooperative Lochia: appropriate Uterine Fundus: firm Incision: healing well, no significant drainage DVT Evaluation: No evidence of DVT seen on physical exam.  Discharge Diagnoses: Term Pregnancy-delivered  Discharge Information: Date: 06/13/2015 Activity: pelvic rest Diet: routine Medications: PNV, Ibuprofen and Percocet Condition: stable Instructions: refer to practice specific booklet Discharge to: home Follow-up Information    Follow up with Almon HerculesOSS,[name] H., MD In 4 weeks.   Specialty:  Obstetrics and Gynecology   Contact information:   [address] [place] [address] [phone]       Newborn Data: Live born female  Birth Weight: 7 lb 1.1 oz (3205 g) APGAR: 8, 9  Home with mother.  Philip AspenCALLAHAN, Bernece Gall 06/13/2015, 9:46 AM

## 2015-06-23 ENCOUNTER — Encounter (HOSPITAL_COMMUNITY): Payer: Self-pay | Admitting: Obstetrics and Gynecology

## 2015-06-23 NOTE — Addendum Note (Signed)
Addendum  created 06/23/15 1310 by Shelton SilvasKevin D Dwayna Kentner, MD   Modules edited: Anesthesia Events

## 2017-05-10 LAB — OB RESULTS CONSOLE GC/CHLAMYDIA
Chlamydia: NEGATIVE
GC PROBE AMP, GENITAL: NEGATIVE

## 2017-05-10 LAB — OB RESULTS CONSOLE ANTIBODY SCREEN: ANTIBODY SCREEN: NEGATIVE

## 2017-05-10 LAB — OB RESULTS CONSOLE RPR: RPR: NONREACTIVE

## 2017-05-10 LAB — OB RESULTS CONSOLE ABO/RH: RH TYPE: POSITIVE

## 2017-05-10 LAB — OB RESULTS CONSOLE RUBELLA ANTIBODY, IGM: Rubella: IMMUNE

## 2017-05-10 LAB — OB RESULTS CONSOLE HIV ANTIBODY (ROUTINE TESTING): HIV: NONREACTIVE

## 2017-05-10 LAB — OB RESULTS CONSOLE HEPATITIS B SURFACE ANTIGEN: Hepatitis B Surface Ag: NEGATIVE

## 2017-10-13 LAB — OB RESULTS CONSOLE GBS: STREP GROUP B AG: POSITIVE

## 2017-10-17 ENCOUNTER — Other Ambulatory Visit: Payer: Self-pay | Admitting: Obstetrics and Gynecology

## 2017-10-18 ENCOUNTER — Encounter (HOSPITAL_COMMUNITY): Payer: Self-pay | Admitting: *Deleted

## 2017-10-18 ENCOUNTER — Inpatient Hospital Stay (HOSPITAL_COMMUNITY)
Admission: AD | Admit: 2017-10-18 | Discharge: 2017-10-18 | Disposition: A | Discharge status: Home / Self Care | Payer: 59 | Source: Ambulatory Visit | Attending: Obstetrics and Gynecology | Admitting: Obstetrics and Gynecology

## 2017-10-18 DIAGNOSIS — Z888 Allergy status to other drugs, medicaments and biological substances status: Secondary | ICD-10-CM | POA: Diagnosis not present

## 2017-10-18 DIAGNOSIS — O133 Gestational [pregnancy-induced] hypertension without significant proteinuria, third trimester: Secondary | ICD-10-CM

## 2017-10-18 DIAGNOSIS — Z3A35 35 weeks gestation of pregnancy: Secondary | ICD-10-CM | POA: Insufficient documentation

## 2017-10-18 DIAGNOSIS — O34219 Maternal care for unspecified type scar from previous cesarean delivery: Secondary | ICD-10-CM | POA: Diagnosis present

## 2017-10-18 DIAGNOSIS — Z9889 Other specified postprocedural states: Secondary | ICD-10-CM | POA: Insufficient documentation

## 2017-10-18 DIAGNOSIS — Z881 Allergy status to other antibiotic agents status: Secondary | ICD-10-CM | POA: Diagnosis not present

## 2017-10-18 HISTORY — DX: Gestational (pregnancy-induced) hypertension without significant proteinuria, unspecified trimester: O13.9

## 2017-10-18 LAB — CBC
HEMATOCRIT: 40.5 % (ref 36.0–46.0)
HEMOGLOBIN: 14.1 g/dL (ref 12.0–15.0)
MCH: 30.3 pg (ref 26.0–34.0)
MCHC: 34.8 g/dL (ref 30.0–36.0)
MCV: 87.1 fL (ref 78.0–100.0)
Platelets: 216 10*3/uL (ref 150–400)
RBC: 4.65 MIL/uL (ref 3.87–5.11)
RDW: 13.6 % (ref 11.5–15.5)
WBC: 11.6 10*3/uL — ABNORMAL HIGH (ref 4.0–10.5)

## 2017-10-18 LAB — COMPREHENSIVE METABOLIC PANEL
ALBUMIN: 3.1 g/dL — AB (ref 3.5–5.0)
ALT: 18 U/L (ref 0–44)
AST: 22 U/L (ref 15–41)
Alkaline Phosphatase: 151 U/L — ABNORMAL HIGH (ref 38–126)
Anion gap: 11 (ref 5–15)
BUN: 11 mg/dL (ref 6–20)
CALCIUM: 9.2 mg/dL (ref 8.9–10.3)
CHLORIDE: 105 mmol/L (ref 98–111)
CO2: 18 mmol/L — AB (ref 22–32)
CREATININE: 0.64 mg/dL (ref 0.44–1.00)
GFR calc non Af Amer: >60 mL/min (ref >60)
GLUCOSE: 76 mg/dL (ref 70–99)
Potassium: 4.1 mmol/L (ref 3.5–5.1)
SODIUM: 134 mmol/L — AB (ref 135–145)
Total Bilirubin: 0.4 mg/dL (ref 0.3–1.2)
Total Protein: 7.4 g/dL (ref 6.5–8.1)

## 2017-10-18 LAB — PROTEIN / CREATININE RATIO, URINE
Creatinine, Urine: 80 mg/dL
PROTEIN CREATININE RATIO: 0.13 mg/mg{creat} (ref 0.00–0.15)
TOTAL PROTEIN, URINE: 10 mg/dL

## 2017-10-18 NOTE — MAU Note (Signed)
Pt sent from MD office with elevated BP.  Has been seeing floaters over the weekend, denies HA, states "I feel lousy."  Has some braxton hicks uc's, denies bleeding or LOF.  Reports good fetal movement.

## 2017-10-18 NOTE — MAU Provider Note (Signed)
History     CSN: [id number]  Arrival date and time: 10/18/17 [id number]   First Provider Initiated Contact with Patient 10/18/17 [id number]      Chief Complaint  Patient presents with  . Hypertension   HPI  Ms.  Nancy Bennett is a 32 y.o. year old [id number] female at 2341w5d weeks gestation who was sent to MAU from [facility] for PEC evaluation. She has been dx'd with gHTN and was returning a 24 hr urine to her OB office today. She reports that she "wasn't feeling right/lousy". She denies feeling any of the sx's of PEC that was discussed with her at her appt on Monday 10/16/17. She asked to have her BP taken at the office today She reports that [facility]. She does report some abdominal pain that "feels like BH ctxs". She denies VB or LOF. She reports good (+) FM today.  Past Medical History:  Diagnosis Date  . Anxiety   . Anxiety   . Depression   . Pregnancy induced hypertension     Past Surgical History:  Procedure Laterality Date  . CESAREAN SECTION N/A 06/11/2015   Procedure: CESAREAN SECTION;  Surgeon: Waynard ReedsKendra Ross, MD;  Location: [facility];  Service: Obstetrics;  Laterality: N/A;  . exploratory bladder     surgery as a child  . TONSILLECTOMY    . WISDOM TOOTH EXTRACTION      History reviewed. No pertinent family history.  Social History   Tobacco Use  . Smoking status: Never Smoker  . Smokeless tobacco: Never Used  Substance Use Topics  . Alcohol use: Yes    Alcohol/week: 1.2 oz    Types: 2 Glasses of wine per week  . Drug use: No    Allergies:  Allergies  Allergen Reactions  . Cephalosporins Hives  . Minocin [Minocycline Hcl] Hives    No medications prior to admission.    Review of Systems  Constitutional: Negative.  Fatigue: "just feel lousy; not right"  HENT: Negative.   Eyes: Negative.   Respiratory: Negative.   Cardiovascular: Negative.   Gastrointestinal: Negative.   Endocrine: Negative.   Genitourinary: Negative.    Musculoskeletal: Negative.   Skin: Negative.   Allergic/Immunologic: Negative.   Neurological: Positive for light-headedness and headaches.  Hematological: Negative.   Psychiatric/Behavioral: Negative.    Physical Exam   Blood pressure (!) 138/93, pulse 91, temperature 98.7 F (37.1 C), temperature source Oral, resp. rate 18, height 5\' 7"  (1.702 m), weight 94.8 kg (209 lb).  Physical Exam  Nursing note and vitals reviewed. Constitutional: She is oriented to person, place, and time. She appears well-developed and well-nourished.  HENT:  Head: Normocephalic and atraumatic.  Eyes: Pupils are equal, round, and reactive to light.  Neck: Normal range of motion.  Cardiovascular: Normal rate, regular rhythm, normal heart sounds and intact distal pulses.  Respiratory: Effort normal and breath sounds normal.  GI: Soft. Bowel sounds are normal.  Genitourinary:  Genitourinary Comments: Pelvic deferred  Musculoskeletal: Normal range of motion.  Neurological: She is alert and oriented to person, place, and time. She has normal reflexes.  Skin: Skin is warm and dry.  Psychiatric: She has a normal mood and affect. Her behavior is normal. Judgment and thought content normal.    MAU Course  Procedures  MDM CBC CMP P/C Ratio Serial BP's  *Consult with Dr. Claiborne Billingsallahan @ 1100 - notified of patient's complaints, assessments, lab & NST results, tx plan d/c home -  ok to d/c home, agrees with plan   Results for orders placed or performed during the hospital encounter of 10/18/17 (from the past 24 hour(s))  Protein / creatinine ratio, urine     Status: None   Collection Time: 10/18/17  9:32 AM  Result Value Ref Range   Creatinine, Urine 80.00 mg/dL   Total Protein, Urine 10 mg/dL   Protein Creatinine Ratio 0.13 0.00 - 0.15 mg/mg[Cre]  CBC     Status: Abnormal   Collection Time: 10/18/17 10:00 AM  Result Value Ref Range   WBC 11.6 (H) 4.0 - 10.5 K/uL   RBC 4.65 3.87 - 5.11 MIL/uL    Hemoglobin 14.1 12.0 - 15.0 g/dL   HCT 40.9 81.1 - 91.4 %   MCV 87.1 78.0 - 100.0 fL   MCH 30.3 26.0 - 34.0 pg   MCHC 34.8 30.0 - 36.0 g/dL   RDW 78.2 95.6 - 21.3 %   Platelets 216 150 - 400 K/uL  Comprehensive metabolic panel     Status: Abnormal   Collection Time: 10/18/17 10:00 AM  Result Value Ref Range   Sodium 134 (L) 135 - 145 mmol/L   Potassium 4.1 3.5 - 5.1 mmol/L   Chloride 105 98 - 111 mmol/L   CO2 18 (L) 22 - 32 mmol/L   Glucose, Bld 76 70 - 99 mg/dL   BUN 11 6 - 20 mg/dL   Creatinine, Ser 0.86 0.44 - 1.00 mg/dL   Calcium 9.2 8.9 - 57.8 mg/dL   Total Protein 7.4 6.5 - 8.1 g/dL   Albumin 3.1 (L) 3.5 - 5.0 g/dL   AST 22 15 - 41 U/L   ALT 18 0 - 44 U/L   Alkaline Phosphatase 151 (H) 38 - 126 U/L   Total Bilirubin 0.4 0.3 - 1.2 mg/dL   GFR calc non Af Amer >60 >60 mL/min   GFR calc Af Amer >60 >60 mL/min   Anion gap 11 5 - 15    Assessment and Plan  Gestational hypertension w/o significant proteinuria in 3rd trimester  - Advised to call the OB office with any sx's of PEC - Information provided on PEC in pregnancy, HTN in pregnancy, & how to take your blood pressure  - Discharge patient - Keep scheduled appointment with [facility] next week - Patient verbalized an understanding of the plan of care and agrees.    Raelyn Mora, MSN, CNM 10/18/2017, 9:55 AM

## 2017-10-18 NOTE — Discharge Instructions (Signed)
Please call your OB office for any symptoms of pre-eclampsia as noted in the information provided for you today.

## 2017-10-19 ENCOUNTER — Telehealth (HOSPITAL_COMMUNITY): Payer: Self-pay | Admitting: *Deleted

## 2017-10-19 ENCOUNTER — Other Ambulatory Visit: Payer: Self-pay | Admitting: Obstetrics and Gynecology

## 2017-10-19 NOTE — Telephone Encounter (Signed)
Preadmission screen  

## 2017-10-20 ENCOUNTER — Encounter (HOSPITAL_COMMUNITY): Payer: Self-pay

## 2017-10-26 ENCOUNTER — Encounter (HOSPITAL_COMMUNITY)
Admission: RE | Admit: 2017-10-26 | Discharge: 2017-10-26 | Disposition: A | Discharge status: Home / Self Care | Payer: 59 | Source: Ambulatory Visit | Attending: Family Medicine | Admitting: Family Medicine

## 2017-10-26 LAB — CBC
HEMATOCRIT: 40.6 % (ref 36.0–46.0)
Hemoglobin: 14.2 g/dL (ref 12.0–15.0)
MCH: 30.7 pg (ref 26.0–34.0)
MCHC: 35 g/dL (ref 30.0–36.0)
MCV: 87.9 fL (ref 78.0–100.0)
PLATELETS: 200 10*3/uL (ref 150–400)
RBC: 4.62 MIL/uL (ref 3.87–5.11)
RDW: 13.3 % (ref 11.5–15.5)
WBC: 11.1 10*3/uL — AB (ref 4.0–10.5)

## 2017-10-26 LAB — TYPE AND SCREEN
ABO/RH(D): O POS
Antibody Screen: NEGATIVE

## 2017-10-26 NOTE — Patient Instructions (Addendum)
Nancy Bennett  10/26/2017   Your procedure is scheduled on:  10/27/2017  [facility] PM.  Pick up the phone at the desk and dial [phone]  Call this number if you have problems the morning of surgery:[phone]  Remember:   Do not eat food:(After Midnight) Desps de medianoche.  Do not drink clear liquids: (After Midnight) Desps de medianoche.  Take these medicines the morning of surgery with A SIP OF WATER: may take zantac and or zyrtec   Do not wear jewelry, make-up or nail polish.  Do not wear lotions, powders, or perfumes. Do not wear deodorant.  Do not shave 48 hours prior to surgery.  Do not bring valuables to the hospital.  [facility] is not   responsible for any belongings or valuables brought to the hospital.  Contacts, dentures or bridgework may not be worn into surgery.  Leave suitcase in the car. After surgery it may be brought to your room.  For patients admitted to the hospital, checkout time is 11:00 AM the day of              discharge.    N/A   Please read over the following fact sheets that you were given:   Surgical Site Infection Prevention

## 2017-10-27 ENCOUNTER — Encounter (HOSPITAL_COMMUNITY): Payer: Self-pay

## 2017-10-27 ENCOUNTER — Encounter (HOSPITAL_COMMUNITY)
Admission: RE | Disposition: A | Discharge status: Home / Self Care | Payer: Self-pay | Source: Home / Self Care | Attending: Obstetrics and Gynecology

## 2017-10-27 ENCOUNTER — Inpatient Hospital Stay (HOSPITAL_COMMUNITY)
Admission: RE | Admit: 2017-10-27 | Discharge: 2017-10-30 | DRG: 788 | Disposition: A | Discharge status: Home / Self Care | Payer: 59 | Attending: Obstetrics and Gynecology | Admitting: Obstetrics and Gynecology

## 2017-10-27 ENCOUNTER — Inpatient Hospital Stay (HOSPITAL_COMMUNITY): Payer: 59 | Admitting: Anesthesiology

## 2017-10-27 ENCOUNTER — Other Ambulatory Visit: Payer: Self-pay

## 2017-10-27 DIAGNOSIS — O99824 Streptococcus B carrier state complicating childbirth: Secondary | ICD-10-CM | POA: Diagnosis present

## 2017-10-27 DIAGNOSIS — O34211 Maternal care for low transverse scar from previous cesarean delivery: Principal | ICD-10-CM | POA: Diagnosis present

## 2017-10-27 DIAGNOSIS — O133 Gestational [pregnancy-induced] hypertension without significant proteinuria, third trimester: Secondary | ICD-10-CM | POA: Diagnosis present

## 2017-10-27 DIAGNOSIS — Z3A37 37 weeks gestation of pregnancy: Secondary | ICD-10-CM | POA: Diagnosis not present

## 2017-10-27 DIAGNOSIS — O134 Gestational [pregnancy-induced] hypertension without significant proteinuria, complicating childbirth: Secondary | ICD-10-CM | POA: Diagnosis present

## 2017-10-27 LAB — COMPREHENSIVE METABOLIC PANEL
ALBUMIN: 3.1 g/dL — AB (ref 3.5–5.0)
ALK PHOS: 162 U/L — AB (ref 38–126)
ALT: 18 U/L (ref 0–44)
ANION GAP: 12 (ref 5–15)
AST: 25 U/L (ref 15–41)
BUN: 10 mg/dL (ref 6–20)
CHLORIDE: 106 mmol/L (ref 98–111)
CO2: 18 mmol/L — AB (ref 22–32)
Calcium: 9.1 mg/dL (ref 8.9–10.3)
Creatinine, Ser: 0.67 mg/dL (ref 0.44–1.00)
GFR calc non Af Amer: >60 mL/min (ref >60)
GLUCOSE: 74 mg/dL (ref 70–99)
POTASSIUM: 4.2 mmol/L (ref 3.5–5.1)
SODIUM: 136 mmol/L (ref 135–145)
Total Bilirubin: 0.6 mg/dL (ref 0.3–1.2)
Total Protein: 6.6 g/dL (ref 6.5–8.1)

## 2017-10-27 LAB — RPR: RPR: NONREACTIVE

## 2017-10-27 SURGERY — Surgical Case
Anesthesia: Monitor Anesthesia Care | Site: Abdomen | Wound class: Clean Contaminated

## 2017-10-27 MED ORDER — ONDANSETRON HCL 4 MG/2ML IJ SOLN: 4.0000 mg | Freq: Three times a day (TID) | INTRAMUSCULAR | Status: DC | PRN

## 2017-10-27 MED ORDER — NALBUPHINE HCL 10 MG/ML IJ SOLN: 5.0000 mg | INTRAMUSCULAR | Status: DC | PRN

## 2017-10-27 MED ORDER — FENTANYL CITRATE (PF) 100 MCG/2ML IJ SOLN
INTRAMUSCULAR | Status: DC | PRN
  Administered 2017-10-27: 10 ug via INTRATHECAL

## 2017-10-27 MED ORDER — COCONUT OIL OIL
1.0000 "application " | TOPICAL_OIL | Status: DC | PRN
  Administered 2017-10-29: 1 via TOPICAL
  Filled 2017-10-27: qty 120

## 2017-10-27 MED ORDER — SODIUM CHLORIDE 0.9% FLUSH: 3.0000 mL | INTRAVENOUS | Status: DC | PRN

## 2017-10-27 MED ORDER — LACTATED RINGERS IV SOLN
INTRAVENOUS | Status: DC
  Administered 2017-10-27: 16:00:00 via INTRAVENOUS

## 2017-10-27 MED ORDER — HYDRALAZINE HCL 20 MG/ML IJ SOLN: 10.0000 mg | Freq: Once | INTRAMUSCULAR | Status: DC | PRN

## 2017-10-27 MED ORDER — OXYTOCIN 40 UNITS IN LACTATED RINGERS INFUSION - SIMPLE MED: 2.5000 [IU]/h | INTRAVENOUS | Status: AC

## 2017-10-27 MED ORDER — BUPIVACAINE IN DEXTROSE 0.75-8.25 % IT SOLN
INTRATHECAL | Status: DC | PRN
  Administered 2017-10-27: 16 mL via INTRATHECAL

## 2017-10-27 MED ORDER — LACTATED RINGERS IV SOLN
INTRAVENOUS | Status: DC
  Administered 2017-10-28: 01:00:00 via INTRAVENOUS

## 2017-10-27 MED ORDER — PROMETHAZINE HCL 25 MG/ML IJ SOLN: 6.2500 mg | INTRAMUSCULAR | Status: DC | PRN

## 2017-10-27 MED ORDER — WITCH HAZEL-GLYCERIN EX PADS: 1.0000 "application " | MEDICATED_PAD | CUTANEOUS | Status: DC | PRN

## 2017-10-27 MED ORDER — DIPHENHYDRAMINE HCL 25 MG PO CAPS: 25.0000 mg | ORAL_CAPSULE | Freq: Four times a day (QID) | ORAL | Status: DC | PRN

## 2017-10-27 MED ORDER — ZOLPIDEM TARTRATE 5 MG PO TABS: 5.0000 mg | ORAL_TABLET | Freq: Every evening | ORAL | Status: DC | PRN

## 2017-10-27 MED ORDER — DEXAMETHASONE SODIUM PHOSPHATE 4 MG/ML IJ SOLN
INTRAMUSCULAR | Status: AC
  Filled 2017-10-27: qty 1

## 2017-10-27 MED ORDER — LABETALOL HCL 5 MG/ML IV SOLN
INTRAVENOUS | Status: AC
  Filled 2017-10-27: qty 4

## 2017-10-27 MED ORDER — SENNOSIDES-DOCUSATE SODIUM 8.6-50 MG PO TABS
2.0000 | ORAL_TABLET | ORAL | Status: DC
  Administered 2017-10-27 – 2017-10-29 (×3): 2 via ORAL
  Filled 2017-10-27 (×3): qty 2

## 2017-10-27 MED ORDER — MORPHINE SULFATE (PF) 0.5 MG/ML IJ SOLN
INTRAMUSCULAR | Status: DC | PRN
  Administered 2017-10-27: .2 mg via INTRATHECAL

## 2017-10-27 MED ORDER — SCOPOLAMINE 1 MG/3DAYS TD PT72
1.0000 | MEDICATED_PATCH | Freq: Once | TRANSDERMAL | Status: AC
  Administered 2017-10-27: 1.5 mg via TRANSDERMAL

## 2017-10-27 MED ORDER — SCOPOLAMINE 1 MG/3DAYS TD PT72
MEDICATED_PATCH | TRANSDERMAL | Status: AC
  Filled 2017-10-27: qty 1

## 2017-10-27 MED ORDER — MEPERIDINE HCL 25 MG/ML IJ SOLN: 6.2500 mg | INTRAMUSCULAR | Status: DC | PRN

## 2017-10-27 MED ORDER — SIMETHICONE 80 MG PO CHEW
80.0000 mg | CHEWABLE_TABLET | ORAL | Status: DC
  Administered 2017-10-27 – 2017-10-29 (×3): 80 mg via ORAL
  Filled 2017-10-27 (×3): qty 1

## 2017-10-27 MED ORDER — LABETALOL HCL 5 MG/ML IV SOLN
5.0000 mg | INTRAVENOUS | Status: AC | PRN
  Administered 2017-10-27 (×4): 5 mg via INTRAVENOUS

## 2017-10-27 MED ORDER — ONDANSETRON HCL 4 MG/2ML IJ SOLN
INTRAMUSCULAR | Status: AC
  Filled 2017-10-27: qty 2

## 2017-10-27 MED ORDER — MORPHINE SULFATE (PF) 0.5 MG/ML IJ SOLN
INTRAMUSCULAR | Status: AC
  Filled 2017-10-27: qty 10

## 2017-10-27 MED ORDER — DIBUCAINE 1 % RE OINT: 1.0000 "application " | TOPICAL_OINTMENT | RECTAL | Status: DC | PRN

## 2017-10-27 MED ORDER — GENTAMICIN SULFATE 40 MG/ML IJ SOLN
INTRAVENOUS | Status: AC
  Administered 2017-10-27: 474 mg via INTRAVENOUS
  Filled 2017-10-27: qty 11.75

## 2017-10-27 MED ORDER — PHENYLEPHRINE 8 MG IN D5W 100 ML (0.08MG/ML) PREMIX OPTIME
INJECTION | INTRAVENOUS | Status: DC | PRN
  Administered 2017-10-27: 40 ug/min via INTRAVENOUS

## 2017-10-27 MED ORDER — OXYTOCIN 10 UNIT/ML IJ SOLN
INTRAVENOUS | Status: DC | PRN
  Administered 2017-10-27: 40 [IU] via INTRAVENOUS

## 2017-10-27 MED ORDER — TETANUS-DIPHTH-ACELL PERTUSSIS 5-2.5-18.5 LF-MCG/0.5 IM SUSP: 0.5000 mL | Freq: Once | INTRAMUSCULAR | Status: DC

## 2017-10-27 MED ORDER — GENTAMICIN SULFATE 40 MG/ML IJ SOLN: INTRAVENOUS | Status: DC

## 2017-10-27 MED ORDER — IBUPROFEN 600 MG PO TABS
600.0000 mg | ORAL_TABLET | Freq: Four times a day (QID) | ORAL | Status: DC
  Administered 2017-10-27 – 2017-10-30 (×11): 600 mg via ORAL
  Filled 2017-10-27 (×12): qty 1

## 2017-10-27 MED ORDER — NALOXONE HCL 4 MG/10ML IJ SOLN: 1.0000 ug/kg/h | INTRAMUSCULAR | Status: DC | PRN

## 2017-10-27 MED ORDER — NALOXONE HCL 0.4 MG/ML IJ SOLN: 0.4000 mg | INTRAMUSCULAR | Status: DC | PRN

## 2017-10-27 MED ORDER — FENTANYL CITRATE (PF) 100 MCG/2ML IJ SOLN
INTRAMUSCULAR | Status: AC
  Filled 2017-10-27: qty 2

## 2017-10-27 MED ORDER — ACETAMINOPHEN 325 MG PO TABS
650.0000 mg | ORAL_TABLET | ORAL | Status: DC | PRN
  Administered 2017-10-28 (×2): 650 mg via ORAL
  Filled 2017-10-27 (×2): qty 2

## 2017-10-27 MED ORDER — DIPHENHYDRAMINE HCL 25 MG PO CAPS: 25.0000 mg | ORAL_CAPSULE | ORAL | Status: DC | PRN

## 2017-10-27 MED ORDER — NALBUPHINE HCL 10 MG/ML IJ SOLN: 5.0000 mg | Freq: Once | INTRAMUSCULAR | Status: DC | PRN

## 2017-10-27 MED ORDER — LACTATED RINGERS IV SOLN
INTRAVENOUS | Status: DC
  Administered 2017-10-27 (×2): via INTRAVENOUS

## 2017-10-27 MED ORDER — DEXAMETHASONE SODIUM PHOSPHATE 4 MG/ML IJ SOLN
INTRAMUSCULAR | Status: DC | PRN
  Administered 2017-10-27: 4 mg via INTRAVENOUS

## 2017-10-27 MED ORDER — MENTHOL 3 MG MT LOZG: 1.0000 | LOZENGE | OROMUCOSAL | Status: DC | PRN

## 2017-10-27 MED ORDER — OXYCODONE-ACETAMINOPHEN 5-325 MG PO TABS
1.0000 | ORAL_TABLET | ORAL | Status: DC | PRN
  Administered 2017-10-29 (×5): 1 via ORAL
  Filled 2017-10-27 (×5): qty 1

## 2017-10-27 MED ORDER — SIMETHICONE 80 MG PO CHEW
80.0000 mg | CHEWABLE_TABLET | Freq: Three times a day (TID) | ORAL | Status: DC
  Administered 2017-10-28 – 2017-10-30 (×6): 80 mg via ORAL
  Filled 2017-10-27 (×6): qty 1

## 2017-10-27 MED ORDER — LABETALOL HCL 5 MG/ML IV SOLN: 20.0000 mg | INTRAVENOUS | Status: DC | PRN

## 2017-10-27 MED ORDER — OXYCODONE-ACETAMINOPHEN 5-325 MG PO TABS: 2.0000 | ORAL_TABLET | ORAL | Status: DC | PRN

## 2017-10-27 MED ORDER — FENTANYL CITRATE (PF) 100 MCG/2ML IJ SOLN: 25.0000 ug | INTRAMUSCULAR | Status: DC | PRN

## 2017-10-27 MED ORDER — DIPHENHYDRAMINE HCL 50 MG/ML IJ SOLN: 12.5000 mg | INTRAMUSCULAR | Status: DC | PRN

## 2017-10-27 MED ORDER — NIFEDIPINE ER OSMOTIC RELEASE 30 MG PO TB24
30.0000 mg | ORAL_TABLET | Freq: Every day | ORAL | Status: DC
  Administered 2017-10-27 – 2017-10-29 (×3): 30 mg via ORAL
  Filled 2017-10-27 (×3): qty 1

## 2017-10-27 MED ORDER — SIMETHICONE 80 MG PO CHEW: 80.0000 mg | CHEWABLE_TABLET | ORAL | Status: DC | PRN

## 2017-10-27 MED ORDER — ONDANSETRON HCL 4 MG/2ML IJ SOLN
INTRAMUSCULAR | Status: DC | PRN
  Administered 2017-10-27: 4 mg via INTRAVENOUS

## 2017-10-27 MED ORDER — PRENATAL MULTIVITAMIN CH
1.0000 | ORAL_TABLET | Freq: Every day | ORAL | Status: DC
  Administered 2017-10-28 – 2017-10-30 (×3): 1 via ORAL
  Filled 2017-10-27 (×3): qty 1

## 2017-10-27 SURGICAL SUPPLY — 32 items
CHLORAPREP W/TINT 26ML (MISCELLANEOUS) ×3 IMPLANT
CLAMP CORD UMBIL (MISCELLANEOUS) IMPLANT
CLOTH BEACON ORANGE TIMEOUT ST (SAFETY) ×3 IMPLANT
DERMABOND ADVANCED (GAUZE/BANDAGES/DRESSINGS) ×2
DERMABOND ADVANCED .7 DNX12 (GAUZE/BANDAGES/DRESSINGS) ×1 IMPLANT
DRSG OPSITE POSTOP 4X10 (GAUZE/BANDAGES/DRESSINGS) ×3 IMPLANT
ELECT REM PT RETURN 9FT ADLT (ELECTROSURGICAL) ×3
ELECTRODE REM PT RTRN 9FT ADLT (ELECTROSURGICAL) ×1 IMPLANT
EXTRACTOR VACUUM M CUP 4 TUBE (SUCTIONS) ×2 IMPLANT
EXTRACTOR VACUUM M CUP 4' TUBE (SUCTIONS) ×1
GLOVE BIO SURGEON STRL SZ7 (GLOVE) ×3 IMPLANT
GLOVE BIOGEL PI IND STRL 7.0 (GLOVE) ×1 IMPLANT
GLOVE BIOGEL PI INDICATOR 7.0 (GLOVE) ×2
GOWN STRL REUS W/TWL LRG LVL3 (GOWN DISPOSABLE) ×6 IMPLANT
KIT ABG SYR 3ML LUER SLIP (SYRINGE) IMPLANT
NEEDLE HYPO 22GX1.5 SAFETY (NEEDLE) IMPLANT
NEEDLE HYPO 25X5/8 SAFETYGLIDE (NEEDLE) IMPLANT
NS IRRIG 1000ML POUR BTL (IV SOLUTION) ×3 IMPLANT
PACK C SECTION WH (CUSTOM PROCEDURE TRAY) ×3 IMPLANT
PAD OB MATERNITY 4.3X12.25 (PERSONAL CARE ITEMS) ×3 IMPLANT
PENCIL SMOKE EVAC W/HOLSTER (ELECTROSURGICAL) ×3 IMPLANT
RTRCTR C-SECT PINK 25CM LRG (MISCELLANEOUS) ×3 IMPLANT
SUT CHROMIC 1 CTX 36 (SUTURE) ×6 IMPLANT
SUT CHROMIC 2 0 CT 1 (SUTURE) ×3 IMPLANT
SUT PDS AB 0 CTX 60 (SUTURE) ×3 IMPLANT
SUT PLAIN 2 0 XLH (SUTURE) ×3 IMPLANT
SUT VIC AB 2-0 CT1 27 (SUTURE) ×2
SUT VIC AB 2-0 CT1 TAPERPNT 27 (SUTURE) ×1 IMPLANT
SUT VIC AB 4-0 KS 27 (SUTURE) IMPLANT
SYR 30ML LL (SYRINGE) IMPLANT
TOWEL OR 17X24 6PK STRL BLUE (TOWEL DISPOSABLE) ×3 IMPLANT
TRAY FOLEY W/BAG SLVR 14FR LF (SET/KITS/TRAYS/PACK) ×3 IMPLANT

## 2017-10-27 NOTE — Transfer of Care (Signed)
Immediate Anesthesia Transfer of Care Note  Patient: Nancy Bennett  Procedure(s) Performed: REPEAT CESAREAN SECTION (N/A Abdomen)  Patient Location: PACU  Anesthesia Type:Spinal  Level of Consciousness: awake, alert  and oriented  Airway & Oxygen Therapy: Patient Spontanous Breathing  Post-op Assessment: Report given to RN and Post -op Vital signs reviewed and stable  Post vital signs: Reviewed and stable  HR 90 BP 151/90 Sats 96% Resp 20  Last Vitals:  Vitals Value Taken Time  BP 151/90 10/27/2017  4:35 PM  Temp    Pulse 75 10/27/2017  4:39 PM  Resp 19 10/27/2017  4:39 PM  SpO2 98 % 10/27/2017  4:39 PM  Vitals shown include unvalidated device data.  Last Pain:  Vitals:   10/27/17 1343  PainSc: 0-No pain         Complications: No apparent anesthesia complications

## 2017-10-27 NOTE — Anesthesia Procedure Notes (Signed)
Spinal  Patient location during procedure: OR Start time: 10/27/2017 3:09 PM End time: 10/27/2017 3:19 PM Staffing Anesthesiologist: Heather RobertsSinger, Lenford Beddow, MD Performed: anesthesiologist  Preanesthetic Checklist Completed: patient identified, surgical consent, pre-op evaluation, timeout performed, IV checked, risks and benefits discussed and monitors and equipment checked Spinal Block Patient position: sitting Prep: DuraPrep Patient monitoring: cardiac monitor, continuous pulse ox and blood pressure Approach: midline Location: L2-3 Injection technique: single-shot Needle Needle type: Pencan  Needle gauge: 24 G Needle length: 9 cm Additional Notes Functioning IV was confirmed and monitors were applied. Sterile prep and drape, including hand hygiene and sterile gloves were used. The patient was positioned and the spine was prepped. The skin was anesthetized with lidocaine.  Free flow of clear CSF was obtained prior to injecting local anesthetic into the CSF.  The spinal needle aspirated freely following injection.  The needle was carefully withdrawn.  The patient tolerated the procedure well.

## 2017-10-27 NOTE — Anesthesia Postprocedure Evaluation (Signed)
Anesthesia Post Note  Patient: Nancy Bennett  Procedure(s) Performed: REPEAT CESAREAN SECTION (N/A Abdomen)     Patient location during evaluation: PACU Anesthesia Type: MAC and Spinal Level of consciousness: awake and alert Pain management: pain level controlled Vital Signs Assessment: post-procedure vital signs reviewed and stable Respiratory status: spontaneous breathing and respiratory function stable Cardiovascular status: blood pressure returned to baseline and stable Postop Assessment: spinal receding Anesthetic complications: no    Last Vitals:  Vitals:   10/27/17 1715 10/27/17 1724  BP: (!) 158/105 (!) 148/93  Pulse: 82 90  Resp: 18 15  Temp:    SpO2: 98% 97%    Last Pain:  Vitals:   10/27/17 1700  TempSrc:   PainSc: 0-No pain   Pain Goal:                 [name] DANIEL

## 2017-10-27 NOTE — H&P (Signed)
Nancy Bennett is a 32 y.o. female presenting for Repeat cesarean section OB History    Gravida  2   Para  1   Term  1   Preterm      AB      Living  1     SAB      TAB      Ectopic      Multiple  0   Live Births  1          Past Medical History:  Diagnosis Date  . Anxiety   . Anxiety   . Depression   . Pregnancy induced hypertension    Past Surgical History:  Procedure Laterality Date  . CESAREAN SECTION N/A 06/11/2015   Procedure: CESAREAN SECTION;  Surgeon: Waynard ReedsKendra Bion Todorov, MD;  Location: [facility];  Service: Obstetrics;  Laterality: N/A;  . exploratory bladder     surgery as a child  . TONSILLECTOMY    . WISDOM TOOTH EXTRACTION     Family History: family history includes Asthma in her mother; Glaucoma in her mother; Heart disease in her paternal grandfather; Hemophilia in her paternal grandfather; Hypertension in her father; Seizures in her father. Social History:  reports that she has never smoked. She has never used smokeless tobacco. She reports that she drinks about 1.2 oz of alcohol per week. She reports that she does not use drugs.     Maternal Diabetes: No Genetic Screening: Normal Maternal Ultrasounds/Referrals: Normal Fetal Ultrasounds or other Referrals:  None Maternal Substance Abuse:  No Significant Maternal Medications:  None Significant Maternal Lab Results:  None Other Comments:  None  ROS History   Height 5\' 7"  (1.702 m), weight 96.6 kg (213 lb), unknown if currently breastfeeding. Exam Physical Exam  Prenatal labs: ABO, Rh: --/--/O POS (07/25 0859) Antibody: NEG (07/25 0859) Rubella: Immune (02/06 0000) RPR: Non Reactive (07/25 0859)  HBsAg: Negative (02/06 0000)  HIV: Non-reactive (02/06 0000)  GBS: Positive (07/12 0000)   Assessment/Plan: 1) Admit 2) Proceed with repeat cesarean section 3) Ancef OCTOR   Waynard ReedsKendra Early Ord 10/27/2017, 2:52 PM

## 2017-10-27 NOTE — Anesthesia Preprocedure Evaluation (Addendum)
Anesthesia Evaluation  Patient identified by MRN, date of birth, ID band Patient awake    Reviewed: Allergy & Precautions, NPO status , Patient's Chart, lab work & pertinent test results  History of Anesthesia Complications Negative for: history of anesthetic complications  Airway Mallampati: II  TM Distance: >3 FB Neck ROM: Full    Dental no notable dental hx. (+) Dental Advisory Given   Pulmonary neg pulmonary ROS,    Pulmonary exam normal        Cardiovascular hypertension, Normal cardiovascular exam     Neuro/Psych PSYCHIATRIC DISORDERS Anxiety Depression negative neurological ROS     GI/Hepatic negative GI ROS, Neg liver ROS,   Endo/Other  negative endocrine ROS  Renal/GU negative Renal ROS  negative genitourinary   Musculoskeletal negative musculoskeletal ROS (+)   Abdominal   Peds negative pediatric ROS (+)  Hematology negative hematology ROS (+)   Anesthesia Other Findings   Reproductive/Obstetrics (+) Pregnancy                            Lab Results  Component Value Date   WBC 11.1 (H) 10/26/2017   HGB 14.2 10/26/2017   HCT 40.6 10/26/2017   MCV 87.9 10/26/2017   PLT 200 10/26/2017   No results found for: INR, PROTIME   Anesthesia Physical  Anesthesia Plan  ASA: II  Anesthesia Plan: Spinal and MAC   Post-op Pain Management:    Induction:   PONV Risk Score and Plan: 2 and Ondansetron, Dexamethasone and Scopolamine patch - Pre-op  Airway Management Planned: Natural Airway  Additional Equipment:   Intra-op Plan:   Post-operative Plan:   Informed Consent: I have reviewed the patients History and Physical, chart, labs and discussed the procedure including the risks, benefits and alternatives for the proposed anesthesia with the patient or authorized representative who has indicated his/her understanding and acceptance.   Dental advisory given  Plan  Discussed with: CRNA and Anesthesiologist  Anesthesia Plan Comments:        Anesthesia Quick Evaluation

## 2017-10-27 NOTE — Op Note (Signed)
Pre-Operative Diagnosis: 1) 37+0-week intrauterine pregnancy 2) gestational hypertension 3) history of prior cesarean section, desires repeat Postoperative Diagnosis: 1) 37+0-week intrauterine pregnancy 2) gestational hypertension 3) history of prior cesarean section, desires repeat  Procedure: Repeat low transverse cesarean section Surgeon: Dr. Waynard ReedsKendra Ronisha Herringshaw Assistant: Dr. Marlow Baarsyanna Clark Operative Findings: Vigorous female infant in the vertex presentation with Apgar scores of 8 at 1 minute and 9 at 5 minutes.  Adhesive disease involving the bladder adherent to the lower uterine segment.  Thin lower uterine segment. Specimen: Placenta to labor and delivery EBL: Total I/O In: 1500 [I.V.:1500] Out: 459 [Urine:200; Blood:259]   Procedure:[name] is an 32 year old gravida 2 para 1001 at 37 weeks and 1 days estimated gestational age who presents for cesarean section. Following the appropriate informed consent the patient was brought to the operating room where spinal anesthesia was administered and found to be adequate. She was placed in the dorsal supine position with a leftward tilt. She was prepped and draped in the normal sterile fashion. Scalpel was then used to make a Pfannenstiel skin incision which was carried down to the underlying layers of soft tissue to the fascia. The fascia was incised in the midline and the fascial incision was extended laterally with Mayo scissors. The superior aspect of the fascial incision was grasped with Coker clamps x2, tented up and the rectus muscles dissected off sharply with the electrocautery unit area and the same procedure was repeated on the inferior aspect of the fascial incision. The rectus muscles were separated in the midline. The abdominal peritoneum was identified, tented up, entered sharply, and the incision was extended superiorly and inferiorly with good visualization of the bladder. The Alexis retractor was then deployed. The vesicouterine peritoneum was  identified, tented up, entered sharply, and the bladder flap was created digitally. Scalpel was then used to make a low transverse incision on the uterus which was extended laterally with blunt dissection. The fetal vertex was identified, delivered easily through the uterine incision followed by the body. The infant was bulb suctioned on the operative field and cried vigorously. After a 1 minute delay, the cord was clamped and cut and the infant was passed to the waiting neonatology team. Placenta was then delivered spontaneously, the uterus was cleared of all clot and debris. The uterine incision was repaired with #1 chromic in running locked fashion. Ovaries and tubes were inspected and normal. The Alexis retractor was removed.  The abdominal peritoneum was reapproximated with 2-0 Vicryl in a running fashion, the rectus muscles was reapproximated with #1 chromic in a running fashion. The fascia was closed with a looped PDS in a running fashion.  The subcutaneous tissue was reapproximated with 2 -0 plain interrupted sutures. The skin was closed with 4-0 vicryl in a subcuticular fashion and Dermabond. All sponge lap and needle counts were correct. Patient tolerated the procedure well and recovered in stable condition following the procedure.

## 2017-10-28 ENCOUNTER — Encounter (HOSPITAL_COMMUNITY): Payer: Self-pay | Admitting: Obstetrics and Gynecology

## 2017-10-28 LAB — COMPREHENSIVE METABOLIC PANEL
ALBUMIN: 2.6 g/dL — AB (ref 3.5–5.0)
ALT: 15 U/L (ref 0–44)
AST: 27 U/L (ref 15–41)
Alkaline Phosphatase: 130 U/L — ABNORMAL HIGH (ref 38–126)
Anion gap: 9 (ref 5–15)
BUN: 12 mg/dL (ref 6–20)
CO2: 21 mmol/L — AB (ref 22–32)
Calcium: 8.6 mg/dL — ABNORMAL LOW (ref 8.9–10.3)
Chloride: 102 mmol/L (ref 98–111)
Creatinine, Ser: 0.79 mg/dL (ref 0.44–1.00)
GFR calc Af Amer: >60 mL/min (ref >60)
GFR calc non Af Amer: >60 mL/min (ref >60)
GLUCOSE: 84 mg/dL (ref 70–99)
POTASSIUM: 4.5 mmol/L (ref 3.5–5.1)
SODIUM: 132 mmol/L — AB (ref 135–145)
Total Bilirubin: 0.5 mg/dL (ref 0.3–1.2)
Total Protein: 5.4 g/dL — ABNORMAL LOW (ref 6.5–8.1)

## 2017-10-28 LAB — CBC
HCT: 36.6 % (ref 36.0–46.0)
Hemoglobin: 12.8 g/dL (ref 12.0–15.0)
MCH: 30.7 pg (ref 26.0–34.0)
MCHC: 35 g/dL (ref 30.0–36.0)
MCV: 87.8 fL (ref 78.0–100.0)
PLATELETS: 203 10*3/uL (ref 150–400)
RBC: 4.17 MIL/uL (ref 3.87–5.11)
RDW: 13.3 % (ref 11.5–15.5)
WBC: 18.3 10*3/uL — AB (ref 4.0–10.5)

## 2017-10-28 NOTE — Progress Notes (Signed)
Patient is doing well.  She is tolerating PO, ambulating, foley catheter was just removed and she has yet to void.  Pain is controlled.  Lochia is appropriate.    BPs in PACU yesterday were consistently 150/90s.  She was started on procardia Xl 30mg  daily.  BPs have been <140/90 since initiation of PO anti-hypertensive.  She is asymptomatic this morning  Vitals:   10/28/17 0055 10/28/17 0101 10/28/17 0103 10/28/17 0554  BP: 133/88 134/85 128/76 128/84  Pulse: 89 71 100 82  Resp: 18   18  Temp: 98.6 F (37 C)   98.2 F (36.8 C)  TempSrc:    Oral  SpO2: 93%   97%  Weight:      Height:        NAD Lungs:   clear to auscultation Heart:   RRR Abdomen:  soft, appropriate tenderness, incisions intact and without erythema or drainage ext:    Symmetric, trace edema bilaterally  Lab Results  Component Value Date   WBC 18.3 (H) 10/28/2017   HGB 12.8 10/28/2017   HCT 36.6 10/28/2017   MCV 87.8 10/28/2017   PLT 203 10/28/2017    --/--/O POS (07/25 0859)/RImmune  A/P    31 y.o. G2P2002 POD 1 s/p RCS at 37 wks for GHTN Routine post op and postpartum care.   GHTN--on procardia XL 30mg  daily with significant improvement.  Will monitor closely today   Desires circumcision, will plan to perform tomorrow

## 2017-10-28 NOTE — Progress Notes (Signed)
Mother of baby was referred for history of depression. Referral screened out by CSW because per chart review, patient's mental health diagnosis originated greater than three years ago without any recent documented occurrences of symptoms present. CSW reviewed prenatal record, patient without depressive symptoms in recent year.   Please contact CSW if mother of baby requests, if needs arise, or if mother of baby scores greater than a nine or answers yes to question ten on [place] Postpartum Depression Screen.   Edwin Dadaarol Elin Seats, MSW, LCSW-A Clinical Social Worker [facility] [phone]

## 2017-10-28 NOTE — Lactation Note (Addendum)
This note was copied from a baby's chart. Lactation Consultation Note Baby 14 hrs old. Baby sleepy most of night. RN assisted mom in latching. Baby BF well. Mom has flat affect. Answered questions.  Newborn behavior, feeding habits, STS, I&O, cluster feeding, supply and demand discussed. Mom encouraged to feed baby 8-12 times/24 hours and with feeding cues.  Mom has everted nipples w/easy flow of colostrum. Hand expression demonstrated. Mom stated she was going to mostly pump and bottle feed BM. Requested hand pump.  Encouraged to call for assist or questions. WH/LC brochure given w/resources, support groups and LC services. Hand pump put in rm. Mom up to BR.  Patient Name: Nancy Bennett ZOXWR'UToday's Date: 10/28/2017 Reason for consult: Initial assessment;Early term 37-38.6wks   Maternal Data Has patient been taught Hand Expression?: Yes Does the patient have breastfeeding experience prior to this delivery?: No  Feeding Feeding Type: Breast Fed Length of feed: 25 min  LATCH Score Latch: Repeated attempts needed to sustain latch, nipple held in mouth throughout feeding, stimulation needed to elicit sucking reflex.  Audible Swallowing: Spontaneous and intermittent  Type of Nipple: Everted at rest and after stimulation  Comfort (Breast/Nipple): Soft / non-tender  Hold (Positioning): Assistance needed to correctly position infant at breast and maintain latch.  LATCH Score: 8  Interventions Interventions: Breast feeding basics reviewed;Support pillows;Breast massage;Hand express;Breast compression;Adjust position;Hand pump  Lactation Tools Discussed/Used     Consult Status Consult Status: Follow-up Date: 10/28/17 Follow-up type: In-patient    [name], [name] 10/28/2017, 6:08 AM

## 2017-10-28 NOTE — Addendum Note (Signed)
Addendum  created 10/28/17 0825 by Angela AdamWrinkle, Brewer Hitchman G, CRNA   Sign clinical note

## 2017-10-28 NOTE — Anesthesia Postprocedure Evaluation (Signed)
Anesthesia Post Note  Patient: Nancy Bennett  Procedure(s) Performed: REPEAT CESAREAN SECTION (N/A Abdomen)     Patient location during evaluation: Mother Baby Anesthesia Type: MAC and Spinal Level of consciousness: awake Pain management: pain level controlled Vital Signs Assessment: post-procedure vital signs reviewed and stable Respiratory status: spontaneous breathing Cardiovascular status: blood pressure returned to baseline Postop Assessment: no headache, spinal receding, no apparent nausea or vomiting and patient able to bend at knees Anesthetic complications: no Comments: Pain score 0.    Last Vitals:  Vitals:   10/28/17 0103 10/28/17 0554  BP: 128/76 128/84  Pulse: 100 82  Resp:  18  Temp:  36.8 C  SpO2:  97%    Last Pain:  Vitals:   10/28/17 0554  TempSrc: Oral  PainSc:    Pain Goal:                 [facility]

## 2017-10-29 MED ORDER — NIFEDIPINE ER OSMOTIC RELEASE 30 MG PO TB24
60.0000 mg | ORAL_TABLET | Freq: Every day | ORAL | Status: DC
  Administered 2017-10-30: 60 mg via ORAL
  Filled 2017-10-29: qty 2

## 2017-10-29 MED ORDER — NIFEDIPINE ER OSMOTIC RELEASE 30 MG PO TB24
30.0000 mg | ORAL_TABLET | Freq: Once | ORAL | Status: AC
  Administered 2017-10-29: 30 mg via ORAL
  Filled 2017-10-29: qty 1

## 2017-10-29 NOTE — Lactation Note (Signed)
This note was copied from a baby's chart. Lactation Consultation Note  Patient Name: Nancy Bennett Reason for consult: Follow-up assessment;Early term 37-38.6wks;Infant weight loss;Nipple pain/trauma  Feeding preference - Breast / formula.  Baby is 3547 hours old  LC reviewed and updated the doc flow sheets per dad.  Mom independently latched the baby while LC at the bedside.  Per mom nipples were sensitive during pregnancy and are still feeling sore with latching.  LC recommended and encouraged mom to use her EBM to nipples liberally.  Also discussed using comfort gels after feedings or pumping. \ Prior to pumping a small dab of coconut oil to both nipples a/ areolas to decrease  The friction with pumping. Per mom using the #24 Flanges and they are comfortable.  Per mom I have a 32 year old at home and I feel breast feeding and a 2 year will be  Difficult. LC explored options with mom and stressed to mom the care givers are here to  Support her feeding preferences and how she plans to feed her baby.  Per mom  With her 1st baby she breast fed and pumped until she went back to work and then  Only pumped and bottle fed.  While LC was in the room, mom latched the baby independently with depth in the football position.  Swallows noted and increased with breast compressions. Baby fed for 8 mins and released. Nipple  Well rounded and per mom the latch was comfortable.  After baby finished, 3 friends entered the room.  LC had discussed earlier - comfort gels , and shells and MBURN aware mom needs to be shown both.  Also discussed Supply and demand and the importance of breast stimulation with latching or pumping.      Maternal Data Has patient been taught Hand Expression?: Yes Does the patient have breastfeeding experience prior to this delivery?: Yes  Feeding Feeding Type: Bottle Fed - Formula Length of feed: 8 min(swallows noted and depth, mom hand  expressed before latch )  LATCH Score Latch: Grasps breast easily, tongue down, lips flanged, rhythmical sucking.  Audible Swallowing: Spontaneous and intermittent  Type of Nipple: Everted at rest and after stimulation  Comfort (Breast/Nipple): Soft / non-tender  Hold (Positioning): Assistance needed to correctly position infant at breast and maintain latch.  LATCH Score: 9  Interventions Interventions: Breast feeding basics reviewed;Skin to skin;Breast massage;Hand express;Breast compression;Adjust position;Support pillows;Position options;Shells;Comfort gels;Coconut oil;Hand pump  Lactation Tools Discussed/Used Tools: Shells;Pump;Coconut oil;Comfort gels Shell Type: Inverted Breast pump type: Manual;Double-Electric Breast Pump WIC Program: No   Consult Status Consult Status: Follow-up Date: 10/30/17 Follow-up type: In-patient    Nancy Bennett Bennett, 3:33 PM

## 2017-10-29 NOTE — Progress Notes (Signed)
Patient is doing well.  She is tolerating PO, ambulating, foley catheter was just removed and she has yet to void.  Pain is controlled.  Lochia is appropriate.    BPs in PACU Friday were consistently 150/90s.  She was started on procardia Xl 30mg  daily.  BPs responded well to the procardia XL overall.  2 diastolics in 90s overnight  Vitals:   10/28/17 1800 10/28/17 2355 10/29/17 0124 10/29/17 0549  BP: 123/86 (!) 134/96 132/88 (!) 143/92  Pulse: 79 75 79 68  Resp: 20   18  Temp: 98.4 F (36.9 C) 98.2 F (36.8 C)    TempSrc:  Oral    SpO2:      Weight:      Height:        NAD Lungs:   clear to auscultation Heart:   RRR Abdomen:  soft, appropriate tenderness, incisions intact and without erythema or drainage ext:    Symmetric, trace edema bilaterally  Lab Results  Component Value Date   WBC 18.3 (H) 10/28/2017   HGB 12.8 10/28/2017   HCT 36.6 10/28/2017   MCV 87.8 10/28/2017   PLT 203 10/28/2017    --/--/O POS (07/25 0859)/RImmune  A/P    31 y.o. G4W1027G2P2002 POD#2 s/p RCS at 37 wks for GHTN Routine post op and postpartum care.   GHTN--on procardia XL 30mg  daily with significant improvement, BPs with increasing diastolics in 90s overnight--will monitor closely today for need to increase dose  Desires circumcision, baby with continued high respiratory rate (had normal CXR overnight per mom's report)--was asked to defer circumcision at this time.

## 2017-10-29 NOTE — Progress Notes (Signed)
BPs have been consistently elevated in the 130-140/90s-100.  Additional dose of procardia XL 30mg  x 1 was given this afternoon and daily dose to start tomorrow AM was increased to 60mg  daily.

## 2017-10-30 MED ORDER — OXYCODONE-ACETAMINOPHEN 5-325 MG PO TABS: 1.0000 | ORAL_TABLET | ORAL | 0 refills | Status: DC | PRN

## 2017-10-30 MED ORDER — NIFEDIPINE ER 60 MG PO TB24: 60.0000 mg | ORAL_TABLET | Freq: Every day | ORAL | 1 refills | Status: DC

## 2017-10-30 NOTE — Discharge Summary (Signed)
Obstetric Discharge Summary Reason for Admission: cesarean section Prenatal Procedures: ultrasound Intrapartum Procedures: cesarean: low cervical, transverse Postpartum Procedures: none Complications-Operative and Postpartum: none Hemoglobin  Date Value Ref Range Status  10/28/2017 12.8 12.0 - 15.0 g/dL Final   HCT  Date Value Ref Range Status  10/28/2017 36.6 36.0 - 46.0 % Final    Physical Exam:  General: alert and cooperative Lochia: appropriate Uterine Fundus: firm Incision: healing well, no significant drainage DVT Evaluation: No evidence of DVT seen on physical exam.  Discharge Diagnoses: Term Pregnancy-delivered  Discharge Information: Date: 10/30/2017 Activity: pelvic rest Diet: routine Medications: PNV, Ibuprofen, Percocet and procardia XL 60 Condition: stable Instructions: refer to practice specific booklet Discharge to: home Follow-up Information    Nancy Reedsoss, [name], MD Follow up on 11/03/2017.   Specialty:  Obstetrics and Gynecology Why:  BP check Contact information: [address] [place] [id number] [phone]           Newborn Data: Live born female  Birth Weight: 6 lb 11.9 oz (3060 g) APGAR: 9, 9  Newborn Delivery   Birth date/time:  10/27/2017 15:44:00 Delivery type:  C-Section, Low Transverse Trial of labor:  No C-section categorization:  Repeat     Home with mother.  Nancy Bennett 10/30/2017, 2:18 PM

## 2017-10-30 NOTE — Discharge Instructions (Signed)

## 2017-10-30 NOTE — Lactation Note (Signed)
This note was copied from a baby's chart. Lactation Consultation Note  Patient Name: Boy Irena Reichmannlizabeth Buskirk ZOXWR'UToday's Date: 10/30/2017 Reason for consult: Follow-up assessment;Early term 37-38.6wks;Nipple pain/trauma  Baby is 2865 hours old / 5 % weight loss  LC reviewed and updated the doc flow sheets  Per mom,  baby last fed at 845 am. Per mom - nipples still sore but able to latch the baby.  Mom has hand pump , shells , and comfort gels.  LC reviewed LC plan from yesterday.  Sore nipple and engorgement prevention and tx reviewed.  Mother informed of post-discharge support and given phone number to the lactation department, including services for phone call assistance; out-patient appointments; and breastfeeding support group. List of other breastfeeding resources in the community given in the handout. Encouraged mother to call for problems or concerns related to breastfeeding.   Maternal Data    Feeding Feeding Type: (per mom last fed at 845 am ) Length of feed: 15 min(per mom )  LATCH Score                   Interventions Interventions: Breast feeding basics reviewed  Lactation Tools Discussed/Used Tools: Shells;Pump;Coconut oil;Comfort gels Shell Type: Inverted Breast pump type: Double-Electric Breast Pump;Manual   Consult Status Consult Status: Complete Date: 10/30/17    Matilde SprangMargaret Ann Ki Luckman 10/30/2017, 9:20 AM

## 2019-07-26 ENCOUNTER — Encounter: Payer: Self-pay | Admitting: Internal Medicine

## 2019-07-26 ENCOUNTER — Ambulatory Visit: Payer: 59 | Admitting: Internal Medicine

## 2019-07-26 ENCOUNTER — Other Ambulatory Visit: Payer: Self-pay

## 2019-07-26 ENCOUNTER — Telehealth: Payer: Self-pay | Admitting: Radiology

## 2019-07-26 VITALS — BP 126/78 | HR 73 | Temp 97.7°F | Ht 67.0 in | Wt 193.4 lb

## 2019-07-26 DIAGNOSIS — I10 Essential (primary) hypertension: Secondary | ICD-10-CM | POA: Diagnosis not present

## 2019-07-26 DIAGNOSIS — R9431 Abnormal electrocardiogram [ECG] [EKG]: Secondary | ICD-10-CM

## 2019-07-26 DIAGNOSIS — R002 Palpitations: Secondary | ICD-10-CM | POA: Diagnosis not present

## 2019-07-26 NOTE — Telephone Encounter (Signed)
Enrolled patient for a 14 day Zio monitor to be mailed to patients home.  

## 2019-07-26 NOTE — Patient Instructions (Signed)
Medication Instructions:  Your physician recommends that you continue on your current medications as directed. Please refer to the Current Medication list given to you today.  *If you need a refill on your cardiac medications before your next appointment, please call your pharmacy*   Testing/Procedures: Dr. Debara Pickett has ordered an EXERCISE TOLERANCE TEST and a 14 day monitor  You will need to have the coronavirus test completed prior to your stress test. An appointment has been made at ______ on  ______. This is a Drive Up Visit at the [facility] [address] Please tell them that you are there for pre-procedure testing. Someone will direct you to the appropriate testing line. Stay in your car and someone will be with you shortly. Please make sure to have all other labs completed before this test because you will need to stay quarantined until your procedure. Please take your insurance card to this test.   ZIO XT- Long Term Monitor Instructions   Your physician has requested you wear your ZIO patch monitor_14_days.   This is a single patch monitor.  [employer] supplies one patch monitor per enrollment.  Additional stickers are not available.   Please do not apply patch if you will be having a Nuclear Stress Test, Echocardiogram, Cardiac CT, MRI, or Chest Xray during the time frame you would be wearing the monitor. The patch cannot be worn during these tests.  You cannot remove and re-apply the ZIO XT patch monitor.   Your ZIO patch monitor will be sent USPS Priority mail from [facility] directly to your home address. The monitor may also be mailed to a PO BOX if home delivery is not available.   It may take 3-5 days to receive your monitor after you have been enrolled.   Once you have received you monitor, please review enclosed instructions.  Your monitor has already been registered assigning a specific monitor serial # to you.   Applying the monitor   Shave hair from  upper left chest.   Hold abrader disc by orange tab.  Rub abrader in 40 strokes over left upper chest as indicated in your monitor instructions.   Clean area with 4 enclosed alcohol pads .  Use all pads to assure are is cleaned thoroughly.  Let dry.   Apply patch as indicated in monitor instructions.  Patch will be place under collarbone on left side of chest with arrow pointing upward.   Rub patch adhesive wings for 2 minutes.Remove white label marked "1".  Remove white label marked "2".  Rub patch adhesive wings for 2 additional minutes.   While looking in a mirror, press and release button in center of patch.  A small green light will flash 3-4 times .  This will be your only indicator the monitor has been turned on.     Do not shower for the first 24 hours.  You may shower after the first 24 hours.   Press button if you feel a symptom. You will hear a small click.  Record Date, Time and Symptom in the Patient Log Book.   When you are ready to remove patch, follow instructions on last 2 pages of Patient Log Book.  Stick patch monitor onto last page of Patient Log Book.   Place Patient Log Book in [place] box.  Use locking tab on box and tape box closed securely.  The [employer] and [employer] has [employer] on it.  Please place in mailbox as soon  as possible.  Your physician should have your test results approximately 7 days after the monitor has been mailed back to [facility]   Call [facility] Customer Care at [phone] if you have questions regarding your ZIO XT patch monitor.  Call them immediately if you see an orange light blinking on your monitor.   If your monitor falls off in less than 4 days contact our Monitor department at [phone].  If your monitor becomes loose or falls off after 4 days call [employer] at [phone] for suggestions on securing your monitor.    Follow-Up: At [facility], you and your health needs are our priority.  As part of our continuing  mission to provide you with exceptional heart care, we have created designated Provider Care Teams.  These Care Teams include your primary Cardiologist (physician) and Advanced Practice Providers (APPs -  Physician Assistants and Nurse Practitioners) who all work together to provide you with the care you need, when you need it.  We recommend signing up for the patient portal called "MyChart".  Sign up information is provided on this After Visit Summary.  MyChart is used to connect with patients for Virtual Visits (Telemedicine).  Patients are able to view lab/test results, encounter notes, upcoming appointments, etc.  Non-urgent messages can be sent to your provider as well.   To learn more about what you can do with MyChart, go to [email or web address].    Your next appointment:   4-6 week(s) - after stress test and monitor  The format for your next appointment:   In Person  Provider:   You may see Dr. Zoila Shutter or one of the following Advanced Practice Providers on your designated Care Team:    Azalee Course, PA-C  Micah Flesher, [place] or   Judy Pimple, [place]    Other Instructions

## 2019-07-26 NOTE — Progress Notes (Signed)
OFFICE CONSULT NOTE  Chief Complaint:  Palpitations  Primary Care Physician: [facility]  HPI:  Nancy Bennett is a 34 y.o. female who is being seen today for the evaluation of palpitations at the request of Caren Macadam, MD.  There is a pleasant 34 year old female kindly referred for evaluation of palpitations.  More recently she is was diagnosed with hypertension during the pregnancy.  She is on Procardia now because she is trying to have another child.  She has 2 boys that are 2 and 4.  She works as a Engineer, agricultural with [employer]  She notes that she has been having some more recent palpitations.  She says they are more likely, on the weekends or at night.  She feels like her heart is flipping around and races at times.  She try to cut back caffeine with no no benefit and then thought more caffeine might be helpful.  She is also try to change her diet.  She thought it might be a side effect of the medication however it is likely unlikely.  He is also had some weight gain.  She reports some mild lower extremity swelling which could be related to the Procardia.  PMHx:  Past Medical History:  Diagnosis Date  . Anxiety   . Anxiety   . Depression   . Pregnancy induced hypertension     Past Surgical History:  Procedure Laterality Date  . CESAREAN SECTION N/A 06/11/2015   Procedure: CESAREAN SECTION;  Surgeon: Vanessa Kick, MD;  Location: [facility];  Service: Obstetrics;  Laterality: N/A;  . CESAREAN SECTION N/A 10/27/2017   Procedure: REPEAT CESAREAN SECTION;  Surgeon: Vanessa Kick, MD;  Location: [facility];  Service: Obstetrics;  Laterality: N/A;  Heather, RNFA  . exploratory bladder     surgery as a child  . TONSILLECTOMY    . WISDOM TOOTH EXTRACTION      FAMHx:  Family History  Problem Relation Age of Onset  . Asthma Mother   . Glaucoma Mother   . Seizures Father        fall  . Hypertension Father   . Heart disease Paternal Grandfather   .  Hemophilia Paternal Grandfather     SOCHx:   reports that she has never smoked. She has never used smokeless tobacco. She reports current alcohol use of about 2.0 standard drinks of alcohol per week. She reports that she does not use drugs.  ALLERGIES:  Allergies  Allergen Reactions  . Cephalosporins Hives  . Minocin [Minocycline Hcl] Hives    ROS: Pertinent items noted in HPI and remainder of comprehensive ROS otherwise negative.  HOME MEDS: Current Outpatient Medications on File Prior to Visit  Medication Sig Dispense Refill  . Cetirizine HCl (ZYRTEC ALLERGY) 10 MG CAPS Zyrtec    . Multiple Vitamins-Minerals (MULTIVITAMIN ADULT EXTRA C PO) multivitamin    . NIFEdipine (PROCARDIA-XL/ADALAT CC) 60 MG 24 hr tablet Take 1 tablet (60 mg total) by mouth daily. 30 tablet 1  . oxybutynin (DITROPAN-XL) 5 MG 24 hr tablet oxybutynin chloride ER 5 mg tablet,extended release 24 hr    . oxyCODONE-acetaminophen (PERCOCET/ROXICET) 5-325 MG tablet Take 1 tablet by mouth every 4 (four) hours as needed (pain scale 4-7). 30 tablet 0  . Prenatal Vit-Fe Fumarate-FA (PRENATAL MULTIVITAMIN) TABS tablet Take 1 tablet by mouth daily at 12 noon.     No current facility-administered medications on file prior to visit.    LABS/IMAGING: No results found for  this or any previous visit (from the past 48 hour(s)). No results found.  LIPID PANEL: No results found for: CHOL, TRIG, HDL, CHOLHDL, VLDL, LDLCALC, LDLDIRECT  WEIGHTS: Wt Readings from Last 3 Encounters:  07/26/19 193 lb 6.4 oz (87.7 kg)  10/27/17 213 lb (96.6 kg)  10/20/17 209 lb (94.8 kg)    VITALS: BP 126/78   Pulse 73   Temp 97.7 F (36.5 C)   Ht 5\' 7"  (1.702 m)   Wt 193 lb 6.4 oz (87.7 kg)   SpO2 98%   BMI 30.29 kg/m   EXAM: General appearance: alert and no distress Neck: no carotid bruit, no JVD and thyroid not enlarged, symmetric, no tenderness/mass/nodules Lungs: clear to auscultation bilaterally Heart: regular rate and  rhythm, S1, S2 normal, no murmur, click, rub or gallop Abdomen: soft, non-tender; bowel sounds normal; no masses,  no organomegaly Extremities: extremities normal, atraumatic, no cyanosis or edema Pulses: 2+ and symmetric Skin: Skin color, texture, turgor normal. No rashes or lesions Neurologic: Grossly normal Psych: Pleasant  EKG: Sinus rhythm at 73, inferior ST and T wave changes possible ischemia- personally reviewed  ASSESSMENT: 1. Palpitations 2. Essential hypertension 3. Abnormal EKG with inferior T wave inversions  PLAN: 1.   Ms. Favor has had recent palpitations that have not improved with dietary changes.  She is also had some weight gain after switching medications.  She is trying to add to her family and therefore was on Procardia because it would be safe to continue during pregnancy.  She does seem to have good blood pressure control but has had some lower extremity edema.  I would like to place a 2-week ZIO monitor to see we can pick up on her palpitations.  In addition she does have some abnormal T wave inversions inferiorly.  Why think it is unlikely this is ischemia and she has no chest pain symptoms, would like for her to undergo exercise treadmill stress testing.  This would be a way to see if the T wave inversions improve or there are any exercise-induced arrhythmias.  In addition she is in a high risk profession as a [employer] and I would like to make sure that there is no significant cardiovascular risk.  It does not sound like there is any significant early onset heart disease in her family.  Thanks again for the kind referral.  Plan follow-up with me afterwards.  Emergency planning/management officer, MD, [facility], FACP  [place]  [facility]  Medical Director of the Advanced Lipid Disorders &  Cardiovascular Risk Reduction Clinic Diplomate of the American Board of Clinical Lipidology Attending Cardiologist  Direct Dial: [phone]  Fax: [phone]  Website:   [email or web address] Taya Ashbaugh 07/26/2019, 9:11 AM

## 2019-07-30 ENCOUNTER — Other Ambulatory Visit (INDEPENDENT_AMBULATORY_CARE_PROVIDER_SITE_OTHER): Payer: 59

## 2019-07-30 DIAGNOSIS — R9431 Abnormal electrocardiogram [ECG] [EKG]: Secondary | ICD-10-CM | POA: Diagnosis not present

## 2019-07-30 DIAGNOSIS — R002 Palpitations: Secondary | ICD-10-CM

## 2019-08-08 ENCOUNTER — Telehealth (HOSPITAL_COMMUNITY): Payer: Self-pay

## 2019-08-08 NOTE — Telephone Encounter (Signed)
Encounter complete. 

## 2019-08-09 ENCOUNTER — Other Ambulatory Visit (HOSPITAL_COMMUNITY)
Admission: RE | Admit: 2019-08-09 | Discharge: 2019-08-09 | Disposition: A | Discharge status: Home / Self Care | Payer: 59 | Source: Ambulatory Visit | Attending: Internal Medicine | Admitting: Internal Medicine

## 2019-08-09 DIAGNOSIS — Z20822 Contact with and (suspected) exposure to covid-19: Secondary | ICD-10-CM | POA: Diagnosis not present

## 2019-08-09 DIAGNOSIS — Z01812 Encounter for preprocedural laboratory examination: Secondary | ICD-10-CM | POA: Insufficient documentation

## 2019-08-10 LAB — SARS CORONAVIRUS 2 (TAT 6-24 HRS): SARS Coronavirus 2: NEGATIVE

## 2019-08-13 ENCOUNTER — Other Ambulatory Visit: Payer: Self-pay

## 2019-08-13 ENCOUNTER — Ambulatory Visit (HOSPITAL_COMMUNITY)
Admission: RE | Admit: 2019-08-13 | Discharge: 2019-08-13 | Disposition: A | Discharge status: Home / Self Care | Payer: 59 | Source: Ambulatory Visit | Attending: Cardiology | Admitting: Cardiology

## 2019-08-13 DIAGNOSIS — R002 Palpitations: Secondary | ICD-10-CM | POA: Insufficient documentation

## 2019-08-13 DIAGNOSIS — R9431 Abnormal electrocardiogram [ECG] [EKG]: Secondary | ICD-10-CM | POA: Insufficient documentation

## 2019-08-13 LAB — EXERCISE TOLERANCE TEST
Estimated workload: 11.7 METS
Exercise duration (min): 10 min
Exercise duration (sec): 1 s
MPHR: 187 {beats}/min
Peak HR: 184 {beats}/min
Percent HR: 98 %
RPE: 17
Rest HR: 71 {beats}/min

## 2019-08-30 ENCOUNTER — Encounter: Payer: Self-pay | Admitting: Physician Assistant

## 2019-08-30 ENCOUNTER — Other Ambulatory Visit: Payer: Self-pay

## 2019-08-30 ENCOUNTER — Ambulatory Visit: Payer: 59 | Admitting: Physician Assistant

## 2019-08-30 VITALS — BP 115/72 | HR 87 | Temp 98.1°F | Ht 67.0 in | Wt 192.0 lb

## 2019-08-30 DIAGNOSIS — R9431 Abnormal electrocardiogram [ECG] [EKG]: Secondary | ICD-10-CM | POA: Diagnosis not present

## 2019-08-30 DIAGNOSIS — R002 Palpitations: Secondary | ICD-10-CM

## 2019-08-30 DIAGNOSIS — I10 Essential (primary) hypertension: Secondary | ICD-10-CM

## 2019-08-30 NOTE — Patient Instructions (Signed)
Medication Instructions:  Your physician recommends that you continue on your current medications as directed. Please refer to the Current Medication list given to you today.  *If you need a refill on your cardiac medications before your next appointment, please call your pharmacy*  Lab Work: NONE ordered at this time of appointment   If you have labs (blood work) drawn today and your tests are completely normal, you will receive your results only by: Marland Kitchen MyChart Message (if you have MyChart) OR . A paper copy in the mail If you have any lab test that is abnormal or we need to change your treatment, we will call you to review the results.  Testing/Procedures: NONE ordered at this time of appointment   Follow-Up: At [facility], you and your health needs are our priority.  As part of our continuing mission to provide you with exceptional heart care, we have created designated Provider Care Teams.  These Care Teams include your primary Cardiologist (physician) and Advanced Practice Providers (APPs -  Physician Assistants and Nurse Practitioners) who all work together to provide you with the care you need, when you need it.  Your next appointment:   As Needed   The format for your next appointment:   Either In Person or Virtual  Provider:   You may see Chrystie Nose, MD or one of the following Advanced Practice Providers on your designated Care Team:    Azalee Course, PA-C  Micah Flesher, PA-C or   Judy Pimple, [place]   Other Instructions

## 2019-08-30 NOTE — Progress Notes (Signed)
Cardiology Office Note:    Date:  09/01/2019   ID:  Nancy Bennett, DOB 1986/02/22, MRN [id number]  PCP:  [facility]  Cardiologist:  Chrystie Nose, MD  Electrophysiologist:  None   Referring MD: [facility]   Chief Complaint  Patient presents with  . Follow-up    seen for Dr. Rennis Golden    History of Present Illness:    Nancy Bennett is a 34 y.o. female with a hx of depression and anxiety who was referred recently for evaluation palpitation.  Recently, she was diagnosed with hypertension during pregnancy.  She was placed on Procardia as she was trying to have another child.  She has tried to cut back caffeine without significant benefit.  On the previous visit, she had T wave inversion in the inferior leads, however denies any chest discomfort.  Outpatient plain old treadmill test was recommended for abnormal EKG and 2-week ZIO monitor was recommended for the palpitation.  Exercise stress test obtained on 08/13/2019 showed borderline ST changes, however she had a great exercise level and had no exertional chest pain during the exercise.  She was able to achieve 11.7 METS of activity.  The T wave inversion in the inferior lead eventually improved upon exercising, therefore after reviewing the strip, Dr. Rennis Golden felt the stress test was normal.  It was suspected the EKG change was due to repolarization abnormality. Heart monitor also which showed very rare PVCs, no significant irregular rhythm.  Patient presents today for follow-up. She says she continued to have occasional palpitation that last 10 minutes each time. She denies any chest pain or shortness of breath. I reviewed her recent heart monitor with her, the result was very reassuring. On exam, she does not have any significant heart murmur. At this time, I did not recommend any further work-up. She can follow-up with cardiology service on as-needed basis. I do not recommend overtreating the current  palpitation with any AV nodal blocking agent.  Past Medical History:  Diagnosis Date  . Anxiety   . Anxiety   . Depression   . Pregnancy induced hypertension     Past Surgical History:  Procedure Laterality Date  . CESAREAN SECTION N/A 06/11/2015   Procedure: CESAREAN SECTION;  Surgeon: Waynard Reeds, MD;  Location: [facility];  Service: Obstetrics;  Laterality: N/A;  . CESAREAN SECTION N/A 10/27/2017   Procedure: REPEAT CESAREAN SECTION;  Surgeon: Waynard Reeds, MD;  Location: [facility];  Service: Obstetrics;  Laterality: N/A;  Heather, RNFA  . exploratory bladder     surgery as a child  . TONSILLECTOMY    . WISDOM TOOTH EXTRACTION      Current Medications: No outpatient medications have been marked as taking for the 08/30/19 encounter (Office Visit) with Azalee Course, PA.     Allergies:   Cephalosporins and Minocin [minocycline hcl]   Social History   Socioeconomic History  . Marital status: Single    Spouse name: Not on file  . Number of children: Not on file  . Years of education: Not on file  . Highest education level: Not on file  Occupational History  . Not on file  Tobacco Use  . Smoking status: Never Smoker  . Smokeless tobacco: Never Used  Substance and Sexual Activity  . Alcohol use: Yes    Alcohol/week: 2.0 standard drinks    Types: 2 Glasses of wine per week  . Drug use: No  . Sexual activity: Yes  Other Topics Concern  . Not on file  Social History Narrative  . Not on file   Social Determinants of Health   Financial Resource Strain:   . Difficulty of Paying Living Expenses:   Food Insecurity:   . Worried About Programme researcher, broadcasting/film/video in the Last Year:   . Barista in the Last Year:   Transportation Needs:   . Freight forwarder (Medical):   Marland Kitchen Lack of Transportation (Non-Medical):   Physical Activity:   . Days of Exercise per Week:   . Minutes of Exercise per Session:   Stress:   . Feeling of Stress :   Social Connections:   .  Frequency of Communication with Friends and Family:   . Frequency of Social Gatherings with Friends and Family:   . Attends Religious Services:   . Active Member of Clubs or Organizations:   . Attends Banker Meetings:   Marland Kitchen Marital Status:      Family History: The patient's family history includes Asthma in her mother; Glaucoma in her mother; Heart disease in her paternal grandfather; Hemophilia in her paternal grandfather; Hypertension in her father; Seizures in her father.  ROS:   Please see the history of present illness.     All other systems reviewed and are negative.  EKGs/Labs/Other Studies Reviewed:    The following studies were reviewed today:  ETT 08/13/2019  Blood pressure demonstrated a normal response to exercise.  Horizontal ST segment depression ST segment depression of 1 mm was noted during stress in the II, III, aVF, V6, V4 and V5 leads, and returning to baseline after less than 1 minute of recovery.  Overall, the patient's exercise capacity was excellent  [facility] Treadmill Score: low risk  Rest T wave inversions improved with exercise and returned to abnormal after the test.   Equivocal stress test -borderline ST segment changes (31mm horizontal to upsloping), no concerning symptoms. Excellent exercise tolerance (10 minutes, 11.7 METS) - suspect EKG changes due to repolarization abnormality.   EKG:  EKG is not ordered today.    Recent Labs: No results found for requested labs within last 8760 hours.  Recent Lipid Panel No results found for: CHOL, TRIG, HDL, CHOLHDL, VLDL, LDLCALC, LDLDIRECT  Physical Exam:    VS:  BP 115/72   Pulse 87   Temp 98.1 F (36.7 C)   Ht 5\' 7"  (1.702 m)   Wt 192 lb (87.1 kg)   SpO2 95%   BMI 30.07 kg/m     Wt Readings from Last 3 Encounters:  08/30/19 192 lb (87.1 kg)  07/26/19 193 lb 6.4 oz (87.7 kg)  10/27/17 213 lb (96.6 kg)     GEN:  Well nourished, well developed in no acute distress HEENT:  Normal NECK: No JVD; No carotid bruits LYMPHATICS: No lymphadenopathy CARDIAC: RRR, no murmurs, rubs, gallops RESPIRATORY:  Clear to auscultation without rales, wheezing or rhonchi  ABDOMEN: Soft, non-tender, non-distended MUSCULOSKELETAL:  No edema; No deformity  SKIN: Warm and dry NEUROLOGIC:  Alert and oriented x 3 PSYCHIATRIC:  Normal affect   ASSESSMENT:    1. Palpitations   2. Nonspecific abnormal electrocardiogram (ECG) (EKG)   3. Essential hypertension    PLAN:    In order of problems listed above:  1. Palpitation: Recent heart monitor results reviewed during today's visit, she has no significant arrhythmia.  At this time I do not recommend any AV nodal blocking agent.  2. Abnormal EKG: Plain old treadmill  exercise test recently was negative.  3. High blood pressure: Treated with Procardia   Medication Adjustments/Labs and Tests Ordered: Current medicines are reviewed at length with the patient today.  Concerns regarding medicines are outlined above.  No orders of the defined types were placed in this encounter.  No orders of the defined types were placed in this encounter.   Patient Instructions  Medication Instructions:  Your physician recommends that you continue on your current medications as directed. Please refer to the Current Medication list given to you today.  *If you need a refill on your cardiac medications before your next appointment, please call your pharmacy*  Lab Work: NONE ordered at this time of appointment   If you have labs (blood work) drawn today and your tests are completely normal, you will receive your results only by: Marland Kitchen MyChart Message (if you have MyChart) OR . A paper copy in the mail If you have any lab test that is abnormal or we need to change your treatment, we will call you to review the results.  Testing/Procedures: NONE ordered at this time of appointment   Follow-Up: At [facility], you and your health needs are our  priority.  As part of our continuing mission to provide you with exceptional heart care, we have created designated Provider Care Teams.  These Care Teams include your primary Cardiologist (physician) and Advanced Practice Providers (APPs -  Physician Assistants and Nurse Practitioners) who all work together to provide you with the care you need, when you need it.  Your next appointment:   As Needed   The format for your next appointment:   Either In Person or Virtual  Provider:   You may see Pixie Casino, MD or one of the following Advanced Practice Providers on your designated Care Team:    Almyra Deforest, PA-C  Fabian Sharp, PA-C or   Roby Lofts, PA-C   Other Instructions      Signed, Almyra Deforest, [place]  09/01/2019 11:10 PM    [place]

## 2019-09-01 ENCOUNTER — Encounter: Payer: Self-pay | Admitting: Physician Assistant

## 2020-06-09 LAB — OB RESULTS CONSOLE HIV ANTIBODY (ROUTINE TESTING): HIV: NONREACTIVE

## 2020-06-09 LAB — OB RESULTS CONSOLE GC/CHLAMYDIA
Chlamydia: NEGATIVE
Gonorrhea: NEGATIVE

## 2020-06-09 LAB — OB RESULTS CONSOLE HEPATITIS B SURFACE ANTIGEN: Hepatitis B Surface Ag: NEGATIVE

## 2020-06-09 LAB — OB RESULTS CONSOLE RUBELLA ANTIBODY, IGM: Rubella: IMMUNE

## 2020-06-09 LAB — OB RESULTS CONSOLE RPR: RPR: NONREACTIVE

## 2020-07-23 ENCOUNTER — Other Ambulatory Visit: Payer: Self-pay | Admitting: Obstetrics and Gynecology

## 2020-07-23 DIAGNOSIS — O09521 Supervision of elderly multigravida, first trimester: Secondary | ICD-10-CM

## 2020-07-23 DIAGNOSIS — Z3A10 10 weeks gestation of pregnancy: Secondary | ICD-10-CM

## 2020-07-23 DIAGNOSIS — Z363 Encounter for antenatal screening for malformations: Secondary | ICD-10-CM

## 2020-07-28 ENCOUNTER — Other Ambulatory Visit: Payer: Self-pay

## 2020-08-10 ENCOUNTER — Ambulatory Visit: Payer: 59

## 2020-08-12 ENCOUNTER — Other Ambulatory Visit: Payer: Self-pay

## 2020-08-12 ENCOUNTER — Ambulatory Visit (HOSPITAL_BASED_OUTPATIENT_CLINIC_OR_DEPARTMENT_OTHER): Payer: 59

## 2020-08-12 ENCOUNTER — Encounter: Payer: Self-pay | Admitting: *Deleted

## 2020-08-12 ENCOUNTER — Ambulatory Visit: Payer: 59 | Attending: Obstetrics and Gynecology | Admitting: *Deleted

## 2020-08-12 ENCOUNTER — Other Ambulatory Visit: Payer: Self-pay | Admitting: *Deleted

## 2020-08-12 VITALS — BP 126/76 | HR 79

## 2020-08-12 DIAGNOSIS — Z363 Encounter for antenatal screening for malformations: Secondary | ICD-10-CM

## 2020-08-12 DIAGNOSIS — O10012 Pre-existing essential hypertension complicating pregnancy, second trimester: Secondary | ICD-10-CM

## 2020-08-12 DIAGNOSIS — O10912 Unspecified pre-existing hypertension complicating pregnancy, second trimester: Secondary | ICD-10-CM

## 2020-08-12 DIAGNOSIS — Z3A19 19 weeks gestation of pregnancy: Secondary | ICD-10-CM

## 2020-08-12 DIAGNOSIS — O09522 Supervision of elderly multigravida, second trimester: Secondary | ICD-10-CM

## 2020-08-12 DIAGNOSIS — O34219 Maternal care for unspecified type scar from previous cesarean delivery: Secondary | ICD-10-CM

## 2020-08-12 DIAGNOSIS — O43192 Other malformation of placenta, second trimester: Secondary | ICD-10-CM

## 2020-08-12 DIAGNOSIS — O09521 Supervision of elderly multigravida, first trimester: Secondary | ICD-10-CM

## 2020-08-12 DIAGNOSIS — O10919 Unspecified pre-existing hypertension complicating pregnancy, unspecified trimester: Secondary | ICD-10-CM

## 2020-08-12 DIAGNOSIS — Z3A10 10 weeks gestation of pregnancy: Secondary | ICD-10-CM

## 2020-08-12 DIAGNOSIS — O321XX0 Maternal care for breech presentation, not applicable or unspecified: Secondary | ICD-10-CM

## 2020-09-09 ENCOUNTER — Ambulatory Visit: Payer: 59 | Attending: Obstetrics

## 2020-09-09 ENCOUNTER — Encounter: Payer: Self-pay | Admitting: *Deleted

## 2020-09-09 ENCOUNTER — Ambulatory Visit: Payer: 59 | Admitting: *Deleted

## 2020-09-09 ENCOUNTER — Other Ambulatory Visit: Payer: Self-pay

## 2020-09-09 VITALS — BP 127/77 | HR 96

## 2020-09-09 DIAGNOSIS — O10912 Unspecified pre-existing hypertension complicating pregnancy, second trimester: Secondary | ICD-10-CM | POA: Diagnosis present

## 2020-09-09 DIAGNOSIS — O43192 Other malformation of placenta, second trimester: Secondary | ICD-10-CM | POA: Diagnosis not present

## 2020-09-09 DIAGNOSIS — O09522 Supervision of elderly multigravida, second trimester: Secondary | ICD-10-CM

## 2020-09-09 DIAGNOSIS — O34219 Maternal care for unspecified type scar from previous cesarean delivery: Secondary | ICD-10-CM

## 2020-09-09 DIAGNOSIS — Z363 Encounter for antenatal screening for malformations: Secondary | ICD-10-CM

## 2020-09-09 DIAGNOSIS — Z3A23 23 weeks gestation of pregnancy: Secondary | ICD-10-CM

## 2020-09-09 DIAGNOSIS — O10012 Pre-existing essential hypertension complicating pregnancy, second trimester: Secondary | ICD-10-CM

## 2020-09-10 ENCOUNTER — Other Ambulatory Visit: Payer: Self-pay | Admitting: *Deleted

## 2020-09-10 DIAGNOSIS — O10912 Unspecified pre-existing hypertension complicating pregnancy, second trimester: Secondary | ICD-10-CM

## 2020-10-08 ENCOUNTER — Ambulatory Visit: Payer: 59

## 2020-11-24 ENCOUNTER — Other Ambulatory Visit: Payer: Self-pay

## 2020-11-24 ENCOUNTER — Encounter (HOSPITAL_COMMUNITY): Payer: Self-pay | Admitting: Obstetrics and Gynecology

## 2020-11-24 ENCOUNTER — Inpatient Hospital Stay (HOSPITAL_COMMUNITY)
Admission: AD | Admit: 2020-11-24 | Discharge: 2020-11-25 | Disposition: A | Discharge status: Home / Self Care | Payer: 59 | Attending: Obstetrics and Gynecology | Admitting: Obstetrics and Gynecology

## 2020-11-24 ENCOUNTER — Inpatient Hospital Stay (HOSPITAL_BASED_OUTPATIENT_CLINIC_OR_DEPARTMENT_OTHER): Payer: 59

## 2020-11-24 DIAGNOSIS — Z7982 Long term (current) use of aspirin: Secondary | ICD-10-CM | POA: Diagnosis not present

## 2020-11-24 DIAGNOSIS — Z3A34 34 weeks gestation of pregnancy: Secondary | ICD-10-CM | POA: Insufficient documentation

## 2020-11-24 DIAGNOSIS — O99891 Other specified diseases and conditions complicating pregnancy: Secondary | ICD-10-CM | POA: Diagnosis not present

## 2020-11-24 DIAGNOSIS — O10013 Pre-existing essential hypertension complicating pregnancy, third trimester: Secondary | ICD-10-CM | POA: Insufficient documentation

## 2020-11-24 DIAGNOSIS — O43193 Other malformation of placenta, third trimester: Secondary | ICD-10-CM

## 2020-11-24 DIAGNOSIS — O09523 Supervision of elderly multigravida, third trimester: Secondary | ICD-10-CM | POA: Diagnosis not present

## 2020-11-24 DIAGNOSIS — O34219 Maternal care for unspecified type scar from previous cesarean delivery: Secondary | ICD-10-CM

## 2020-11-24 DIAGNOSIS — O163 Unspecified maternal hypertension, third trimester: Secondary | ICD-10-CM

## 2020-11-24 DIAGNOSIS — R197 Diarrhea, unspecified: Secondary | ICD-10-CM | POA: Insufficient documentation

## 2020-11-24 DIAGNOSIS — Z20822 Contact with and (suspected) exposure to covid-19: Secondary | ICD-10-CM | POA: Diagnosis not present

## 2020-11-24 LAB — COMPREHENSIVE METABOLIC PANEL
ALT: 19 U/L (ref 0–44)
AST: 25 U/L (ref 15–41)
Albumin: 2.7 g/dL — ABNORMAL LOW (ref 3.5–5.0)
Alkaline Phosphatase: 188 U/L — ABNORMAL HIGH (ref 38–126)
Anion gap: 10 (ref 5–15)
BUN: 11 mg/dL (ref 6–20)
CO2: 19 mmol/L — ABNORMAL LOW (ref 22–32)
Calcium: 9.5 mg/dL (ref 8.9–10.3)
Chloride: 104 mmol/L (ref 98–111)
Creatinine, Ser: 0.84 mg/dL (ref 0.44–1.00)
GFR, Estimated: >60 mL/min (ref >60)
Glucose, Bld: 84 mg/dL (ref 70–99)
Potassium: 4 mmol/L (ref 3.5–5.1)
Sodium: 133 mmol/L — ABNORMAL LOW (ref 135–145)
Total Bilirubin: 0.5 mg/dL (ref 0.3–1.2)
Total Protein: 7 g/dL (ref 6.5–8.1)

## 2020-11-24 LAB — CBC
HCT: 41.1 % (ref 36.0–46.0)
Hemoglobin: 14.2 g/dL (ref 12.0–15.0)
MCH: 30.6 pg (ref 26.0–34.0)
MCHC: 34.5 g/dL (ref 30.0–36.0)
MCV: 88.6 fL (ref 80.0–100.0)
Platelets: 247 10*3/uL (ref 150–400)
RBC: 4.64 MIL/uL (ref 3.87–5.11)
RDW: 13 % (ref 11.5–15.5)
WBC: 18.9 10*3/uL — ABNORMAL HIGH (ref 4.0–10.5)
nRBC: 0 % (ref 0.0–0.2)

## 2020-11-24 LAB — PROTEIN / CREATININE RATIO, URINE
Creatinine, Urine: 171.76 mg/dL
Protein Creatinine Ratio: 0.23 mg/mg{Cre} — ABNORMAL HIGH (ref 0.00–0.15)
Total Protein, Urine: 39 mg/dL

## 2020-11-24 LAB — WET PREP, GENITAL
Clue Cells Wet Prep HPF POC: NONE SEEN
Sperm: NONE SEEN
Trich, Wet Prep: NONE SEEN
Yeast Wet Prep HPF POC: NONE SEEN

## 2020-11-24 LAB — URINALYSIS, ROUTINE W REFLEX MICROSCOPIC
Bilirubin Urine: NEGATIVE
Glucose, UA: NEGATIVE mg/dL
Hgb urine dipstick: NEGATIVE
Ketones, ur: 20 mg/dL — AB
Nitrite: NEGATIVE
Protein, ur: 30 mg/dL — AB
Specific Gravity, Urine: 1.019 (ref 1.005–1.030)
Trans Epithel, UA: 2
pH: 6 (ref 5.0–8.0)

## 2020-11-24 LAB — RESP PANEL BY RT-PCR (FLU A&B, COVID) ARPGX2
Influenza A by PCR: NEGATIVE
Influenza B by PCR: NEGATIVE
SARS Coronavirus 2 by RT PCR: NEGATIVE

## 2020-11-24 LAB — TYPE AND SCREEN
ABO/RH(D): O POS
Antibody Screen: NEGATIVE

## 2020-11-24 MED ORDER — LACTATED RINGERS IV BOLUS
500.0000 mL | Freq: Once | INTRAVENOUS | Status: AC
  Administered 2020-11-24: 500 mL via INTRAVENOUS

## 2020-11-24 MED ORDER — ASPIRIN EC 81 MG PO TBEC
81.0000 mg | DELAYED_RELEASE_TABLET | Freq: Every day | ORAL | Status: DC
  Administered 2020-11-24 – 2020-11-25 (×2): 81 mg via ORAL
  Filled 2020-11-24 (×2): qty 1

## 2020-11-24 MED ORDER — SODIUM CHLORIDE 0.9 % IV SOLN: 250.0000 mL | INTRAVENOUS | Status: DC | PRN

## 2020-11-24 MED ORDER — LABETALOL HCL 5 MG/ML IV SOLN: 80.0000 mg | INTRAVENOUS | Status: DC | PRN

## 2020-11-24 MED ORDER — HYDRALAZINE HCL 20 MG/ML IJ SOLN: 10.0000 mg | INTRAMUSCULAR | Status: DC | PRN

## 2020-11-24 MED ORDER — BETAMETHASONE SOD PHOS & ACET 6 (3-3) MG/ML IJ SUSP
12.0000 mg | INTRAMUSCULAR | Status: AC
Start: 2020-11-24 — End: 2020-11-25
  Administered 2020-11-24 – 2020-11-25 (×2): 12 mg via INTRAMUSCULAR
  Filled 2020-11-24: qty 5

## 2020-11-24 MED ORDER — CALCIUM CARBONATE ANTACID 500 MG PO CHEW: 2.0000 | CHEWABLE_TABLET | ORAL | Status: DC | PRN

## 2020-11-24 MED ORDER — LOPERAMIDE HCL 2 MG PO CAPS
2.0000 mg | ORAL_CAPSULE | Freq: Once | ORAL | Status: AC
  Administered 2020-11-24: 2 mg via ORAL
  Filled 2020-11-24: qty 1

## 2020-11-24 MED ORDER — SODIUM CHLORIDE 0.9% FLUSH
3.0000 mL | Freq: Two times a day (BID) | INTRAVENOUS | Status: DC
  Administered 2020-11-24: 3 mL via INTRAVENOUS

## 2020-11-24 MED ORDER — LORATADINE 10 MG PO TABS
10.0000 mg | ORAL_TABLET | Freq: Every day | ORAL | Status: DC
  Administered 2020-11-24 – 2020-11-25 (×2): 10 mg via ORAL
  Filled 2020-11-24 (×2): qty 1

## 2020-11-24 MED ORDER — PRENATAL MULTIVITAMIN CH
1.0000 | ORAL_TABLET | Freq: Every day | ORAL | Status: DC
  Administered 2020-11-24: 1 via ORAL
  Filled 2020-11-24: qty 1

## 2020-11-24 MED ORDER — LABETALOL HCL 5 MG/ML IV SOLN: 40.0000 mg | INTRAVENOUS | Status: DC | PRN

## 2020-11-24 MED ORDER — WITCH HAZEL-GLYCERIN EX PADS: MEDICATED_PAD | CUTANEOUS | Status: DC | PRN

## 2020-11-24 MED ORDER — PANTOPRAZOLE SODIUM 40 MG PO TBEC
40.0000 mg | DELAYED_RELEASE_TABLET | Freq: Every day | ORAL | Status: DC
  Administered 2020-11-24 – 2020-11-25 (×2): 40 mg via ORAL
  Filled 2020-11-24 (×2): qty 1

## 2020-11-24 MED ORDER — ACETAMINOPHEN 325 MG PO TABS: 650.0000 mg | ORAL_TABLET | ORAL | Status: DC | PRN

## 2020-11-24 MED ORDER — NIFEDIPINE ER OSMOTIC RELEASE 30 MG PO TB24: 60.0000 mg | ORAL_TABLET | Freq: Every day | ORAL | Status: DC

## 2020-11-24 MED ORDER — LABETALOL HCL 5 MG/ML IV SOLN
20.0000 mg | INTRAVENOUS | Status: DC | PRN
  Administered 2020-11-24: 20 mg via INTRAVENOUS
  Filled 2020-11-24: qty 4

## 2020-11-24 MED ORDER — NIFEDIPINE ER OSMOTIC RELEASE 60 MG PO TB24
60.0000 mg | ORAL_TABLET | Freq: Every day | ORAL | Status: DC
  Administered 2020-11-24 – 2020-11-25 (×2): 60 mg via ORAL
  Filled 2020-11-24: qty 2
  Filled 2020-11-24: qty 1

## 2020-11-24 MED ORDER — LOPERAMIDE HCL 2 MG PO CAPS
4.0000 mg | ORAL_CAPSULE | ORAL | Status: DC | PRN
  Administered 2020-11-24 – 2020-11-25 (×2): 4 mg via ORAL
  Filled 2020-11-24 (×3): qty 2

## 2020-11-24 MED ORDER — SODIUM CHLORIDE 0.9% FLUSH: 3.0000 mL | INTRAVENOUS | Status: DC | PRN

## 2020-11-24 MED ORDER — DOCUSATE SODIUM 100 MG PO CAPS
100.0000 mg | ORAL_CAPSULE | Freq: Every day | ORAL | Status: DC
  Filled 2020-11-24: qty 1

## 2020-11-24 MED ORDER — ZOLPIDEM TARTRATE 5 MG PO TABS: 5.0000 mg | ORAL_TABLET | Freq: Every evening | ORAL | Status: DC | PRN

## 2020-11-24 NOTE — Progress Notes (Signed)
Vitals:   11/24/20 1116 11/24/20 1122 11/24/20 1503 11/24/20 1936  BP: (!) 150/88 138/86 128/77 132/83  Pulse: 92 93 (!) 105 (!) 101  Resp:    18  Temp:   98.4 F (36.9 C) 98.2 F (36.8 C)  TempSrc:   Oral Oral  SpO2:    100%  Weight:      Height:        FHTS 120s, gSTV, NST R Toco occ  [place] 5#14, 81%ile. BPP 8/10.  VTX. AFI 16.42.  Pt stable tonight.  No severe range BPs since this am.

## 2020-11-24 NOTE — MAU Note (Signed)
Started on Saturday, thought it was false labor.  Continued into Sunday.  Sunday she started having diarrhea also.  Both have continued.diarrhea is very watery now. Pains are getting closer and more uncomfortable, getting nauseous with them, coming every .  Is on Procardia for BP. Has been on it throughout preg. BP 157/111 last night later 150's/97. Was 147/97 this morning. No one else at home is sick.

## 2020-11-24 NOTE — Progress Notes (Signed)
Asked By Dr Henderson Cloud to see patient in Ante Rm 101.  Patient is a 34.1 Wk G4P2 with  a history of chronic hypertension and 2 prior c/s admitted with diarrhea and blood pressures in the 140s-150s over 90s-100s.  Pt Scheduled to be admitted  9/5.  IV access 20 g R antecubital.  Wt Readings from Last 3 Encounters:  11/24/20 92.1 kg  08/30/19 87.1 kg  07/26/19 87.7 kg   Temp Readings from Last 3 Encounters:  11/24/20 36.9 C (Oral)  08/30/19 36.7 C  07/26/19 36.5 C   BP Readings from Last 3 Encounters:  11/24/20 128/77  09/09/20 127/77  08/12/20 126/76   Pulse Readings from Last 3 Encounters:  11/24/20 (!) 105  09/09/20 96  08/12/20 79   Pt resting comfortably after just eating. Denies HA or visual disturbances. Denies problems with anesthesia in her past . Denies respiratory problems. Denies chest pain or  problems with her hear other than stated above. After explaining to patient we were just seeing her to be aware of her presence and that there were no immediate plans as for her to see [place] she felt comfortable. And had no questions.  Tera Mater. Richardson Landry, M.D.

## 2020-11-24 NOTE — H&P (Signed)
Refer to the full CNM note below.  Briefly:  This is a 35 y.o. [id number] [redacted]w[redacted]d with chronic hypertension and progressively worsening hypertension.  She presented to MAU today for cramping from diarrhea and also worsening HTN at home.  Pt actually had one severe range BP on 08-18 in office of 140/100.  Her procardia was increased to 60 mg XL at that point.  Since then, her BPs at home have all been in the 150/90 range.    Pt recently had started with diarrhea without any known cause since Saturday.  Pt was hydrated in MAU and pt has been on loperimide.    Today her BPs were mostly 150/90s but two were severe range- one may have been artificially elevated.  However, with increased BPs in the setting of dehydration worries me that her BPs actually may be higher with hydration.  Her labs today are normal (although her Cr went from 0.79 on 08-18 to 0.84 today) and she has no other sx.  She will be admitted for observation, [place] for EFW (last was done on 08-03--EFW 4#13, 2169g, 67.7%, AFI 9.92, BPP 8/8.)  Will also do BPP/AFI for probably superimposed pre-e.   Prenatal Transfer Tool  Maternal Diabetes: No Genetic Screening: Normal Maternal Ultrasounds/Referrals: Normal Fetal Ultrasounds or other Referrals:  None Maternal Substance Abuse:  No Significant Maternal Medications:  Meds include: Other:  nifedipine Significant Maternal Lab Results:None    Loney Laurence   Chief Complaint  Patient presents with   Contractions   Hypertension   Diarrhea    HPI   HTN Chronic, takes Procardia 60, has not yet taken today   Diarrhea Since Saturday, 100% water No fever No one else sick at home   Contractions   Henderson Cloud, for cesarean (3-peat)          Past Medical History:  Diagnosis Date   Anxiety     Anxiety     Depression     Pregnancy induced hypertension             Past Surgical History:  Procedure Laterality Date   CESAREAN SECTION N/A 06/11/2015    Procedure: CESAREAN  SECTION;  Surgeon: Waynard Reeds, MD;  Location: [facility];  Service: Obstetrics;  Laterality: N/A;   CESAREAN SECTION N/A 10/27/2017    Procedure: REPEAT CESAREAN SECTION;  Surgeon: Waynard Reeds, MD;  Location: [facility] BIRTHING SUIT[facility];  Service: Obstetrics;  Laterality: N/A;  Heather, RNFA   exploratory bladder        surgery as a child   TONSILLECTOMY       WISDOM TOOTH EXTRACTION               Family History  Problem Relation Age of Onset   Asthma Mother     Glaucoma Mother     Seizures Father          fall   Hypertension Father     Heart disease Paternal Grandfather     Hemophilia Paternal Grandfather        Social History         Tobacco Use   Smoking status: Never   Smokeless tobacco: Never  Vaping Use   Vaping Use: Never used  Substance Use Topics   Alcohol use: Not Currently      Alcohol/week: 2.0 standard drinks      Types: 2 Glasses of wine per week   Drug use: No      Allergies:      Allergies  Allergen Reactions   Cephalosporins Hives   Minocin [Minocycline Hcl] Hives             Medications Prior to Admission  Medication Sig Dispense Refill Last Dose   aspirin EC 81 MG tablet Take 81 mg by mouth daily. Swallow whole.     11/23/2020   Fexofenadine HCl (ALLEGRA ALLERGY PO) Take by mouth.     11/23/2020   loperamide (IMODIUM A-D) 2 MG capsule Take by mouth as needed for diarrhea or loose stools.     11/23/2020 at 1730   NIFEdipine (PROCARDIA-XL/ADALAT CC) 60 MG 24 hr tablet Take 1 tablet (60 mg total) by mouth daily. 30 tablet 1 11/23/2020   omeprazole (PRILOSEC) 20 MG capsule Take 20 mg by mouth daily.     11/23/2020   Prenatal Vit-Fe Fumarate-FA (PRENATAL MULTIVITAMIN) TABS tablet Take 1 tablet by mouth daily at 12 noon.     11/23/2020   Cetirizine HCl (ZYRTEC ALLERGY) 10 MG CAPS Zyrtec (Patient not taking: Reported on 08/12/2020)         Multiple Vitamins-Minerals (MULTIVITAMIN ADULT EXTRA C PO) multivitamin (Patient not taking: Reported on 08/12/2020)          oxybutynin (DITROPAN-XL) 5 MG 24 hr tablet oxybutynin chloride ER 5 mg tablet,extended release 24 hr (Patient not taking: Reported on 08/12/2020)         VITAMIN D PO Take by mouth.            Review of Systems Physical Exam    Blood pressure (!) 164/106, pulse 98, temperature 98.6 F (37 C), temperature source Oral, resp. rate 18, height 5\' 7"  (1.702 m), weight 92.1 kg, last menstrual period 03/30/2020, SpO2 100 %, unknown if currently breastfeeding.   Physical Exam   MAU Course  Procedures      Orders Placed This Encounter  Procedures   Wet prep, genital   Resp Panel by RT-PCR (Flu A&B, Covid) Nasopharyngeal Swab   Urinalysis, Routine w reflex microscopic Urine, Clean Catch   CBC   Comprehensive metabolic panel   Protein / creatinine ratio, urine   Fetal fibronectin   Notify physician (specify) Confirmatory reading of BP> 160/110 15 minutes later   Measure blood pressure   Airborne and Contact precautions    Patient Vitals for the past 24 hrs:   BP Temp Temp src Pulse Resp SpO2 Height Weight  11/24/20 1048 (!) 147/91 -- -- 93 -- -- -- --  11/24/20 1046 (!) 161/93 -- -- 92 -- -- -- --  11/24/20 1031 (!) 151/91 -- -- 89 -- -- -- --  11/24/20 1016 (!) 153/93 -- -- 89 -- -- -- --  11/24/20 1001 (!) 154/91 -- -- 88 -- -- -- --  11/24/20 0931 (!) 153/97 -- -- 87 -- -- -- --  11/24/20 0916 (!) 150/97 -- -- 96 -- -- -- --  11/24/20 0903 (!) 143/103 -- -- 88 -- -- -- --  11/24/20 0853 (!) 148/91 -- -- 92 -- -- -- --  11/24/20 0838 (!) 164/106 -- -- 98 -- -- -- --  11/24/20 0814 (!) 155/95 98.6 F (37 C) Oral 94 18 100 % 5\' 7"  (1.702 m) 92.1 kg    Lab Results Last 24 Hours       Results for orders placed or performed during the hospital encounter of 11/24/20 (from the past 24 hour(s))  Urinalysis, Routine w reflex microscopic Urine, Clean Catch     Status: Abnormal    Collection Time: 11/24/20  8:21 AM  Result Value Ref Range    Color, Urine YELLOW YELLOW    APPearance  CLOUDY (A) CLEAR    Specific Gravity, Urine 1.019 1.005 - 1.030    pH 6.0 5.0 - 8.0    Glucose, UA NEGATIVE NEGATIVE mg/dL    Hgb urine dipstick NEGATIVE NEGATIVE    Bilirubin Urine NEGATIVE NEGATIVE    Ketones, ur 20 (A) NEGATIVE mg/dL    Protein, ur 30 (A) NEGATIVE mg/dL    Nitrite NEGATIVE NEGATIVE    Leukocytes,Ua LARGE (A) NEGATIVE    RBC / HPF 6-10 0 - 5 RBC/hpf    WBC, UA 21-50 0 - 5 WBC/hpf    Bacteria, UA RARE (A) NONE SEEN    Squamous Epithelial / LPF 21-50 0 - 5    Trans Epithel, UA 2      Mucus PRESENT    Protein / creatinine ratio, urine     Status: Abnormal    Collection Time: 11/24/20  8:49 AM  Result Value Ref Range    Creatinine, Urine 171.76 mg/dL    Total Protein, Urine 39 mg/dL    Protein Creatinine Ratio 0.23 (H) 0.00 - 0.15 mg/mg[Cre]  CBC     Status: Abnormal    Collection Time: 11/24/20  9:04 AM  Result Value Ref Range    WBC 18.9 (H) 4.0 - 10.5 K/uL    RBC 4.64 3.87 - 5.11 MIL/uL    Hemoglobin 14.2 12.0 - 15.0 g/dL    HCT 82.941.1 56.236.0 - 13.046.0 %    MCV 88.6 80.0 - 100.0 fL    MCH 30.6 26.0 - 34.0 pg    MCHC 34.5 30.0 - 36.0 g/dL    RDW 86.513.0 78.411.5 - 69.615.5 %    Platelets 247 150 - 400 K/uL    nRBC 0.0 0.0 - 0.2 %  Comprehensive metabolic panel     Status: Abnormal    Collection Time: 11/24/20  9:04 AM  Result Value Ref Range    Sodium 133 (L) 135 - 145 mmol/L    Potassium 4.0 3.5 - 5.1 mmol/L    Chloride 104 98 - 111 mmol/L    CO2 19 (L) 22 - 32 mmol/L    Glucose, Bld 84 70 - 99 mg/dL    BUN 11 6 - 20 mg/dL    Creatinine, Ser 2.950.84 0.44 - 1.00 mg/dL    Calcium 9.5 8.9 - 28.410.3 mg/dL    Total Protein 7.0 6.5 - 8.1 g/dL    Albumin 2.7 (L) 3.5 - 5.0 g/dL    AST 25 15 - 41 U/L    ALT 19 0 - 44 U/L    Alkaline Phosphatase 188 (H) 38 - 126 U/L    Total Bilirubin 0.5 0.3 - 1.2 mg/dL    GFR, Estimated >13>60 >24>60 mL/min    Anion gap 10 5 - 15  Wet prep, genital     Status: Abnormal    Collection Time: 11/24/20  9:28 AM    Specimen: Cervix  Result Value Ref  Range    Yeast Wet Prep HPF POC NONE SEEN NONE SEEN    Trich, Wet Prep NONE SEEN NONE SEEN    Clue Cells Wet Prep HPF POC NONE SEEN NONE SEEN    WBC, Wet Prep HPF POC MANY (A) NONE SEEN    Sperm NONE SEEN            Assessment and Plan  --35 y.o. [id number] at 3475w1d  --Reactive tracing --Chronic  HTN on Procardia 60 XL daily --Severe range BP x 2 in MAU --Per Dr. Henderson Cloud, admit to [facility] 11/24/2020, 8:56 AM

## 2020-11-24 NOTE — MAU Provider Note (Signed)
History     CSN: [id number]  Arrival date and time: 11/24/20 0932   Event Date/Time   First Provider Initiated Contact with Patient 11/24/20 [phone]      Chief Complaint  Patient presents with   Contractions   Hypertension   Diarrhea   HPI Nancy Bennett is a 35 y.o. [id number] at [redacted]w[redacted]d who presents to MAU with multiple complaints:  HTN Patient's pregnancy is complicated by Chronic Hypertension, managed with Procardia XL 60 mg. Patient has not yet taken today's dose. She endorses blood pressure readings of 150s/90s on her home cuff. She denies headache, visua disturbances, RUQ/epigastric pain, new onset swelling or weight gain.  Diarrhea This is a new problem, onset Saturday 08/20. Patient's diarrhea has gradually worsened from loose stool to pure liquid. She has not taken medication or tried other treatments for this complaint. She denies fever. No one else in her home is ill. She denies weakness, syncope, dysuria, flank pain.   Contractions This is a new problem, onset coinciding with onset of diarrhea and worsening over time.  Patient's pain is located in her lower abdomen. Pain score is 7/10. She has not taken medication or tried other treatments for this complaint. She denies vaginal bleeding, leaking of fluid, decreased fetal movement, fever, falls, or recent illness.   Patient receives care with Dr. Henderson Cloud and her next appointment is tomorrow 08/24.  OB History     Gravida  4   Para  2   Term  2   Preterm      AB  1   Living  2      SAB  0   IAB  1   Ectopic      Multiple  0   Live Births  2           Past Medical History:  Diagnosis Date   Anxiety    Anxiety    Depression    Pregnancy induced hypertension     Past Surgical History:  Procedure Laterality Date   CESAREAN SECTION N/A 06/11/2015   Procedure: CESAREAN SECTION;  Surgeon: Waynard Reeds, MD;  Location: [facility];  Service: Obstetrics;  Laterality: N/A;   CESAREAN SECTION N/A 10/27/2017    Procedure: REPEAT CESAREAN SECTION;  Surgeon: Waynard Reeds, MD;  Location: [facility] BIRTHING SUIT[facility];  Service: Obstetrics;  Laterality: N/A;  Heather, RNFA   exploratory bladder     surgery as a child   TONSILLECTOMY     WISDOM TOOTH EXTRACTION      Family History  Problem Relation Age of Onset   Asthma Mother    Glaucoma Mother    Seizures Father        fall   Hypertension Father    Heart disease Paternal Grandfather    Hemophilia Paternal Grandfather     Social History   Tobacco Use   Smoking status: Never   Smokeless tobacco: Never  Vaping Use   Vaping Use: Never used  Substance Use Topics   Alcohol use: Not Currently    Alcohol/week: 2.0 standard drinks    Types: 2 Glasses of wine per week   Drug use: No    Allergies:  Allergies  Allergen Reactions   Cephalosporins Hives   Minocin [Minocycline Hcl] Hives    Medications Prior to Admission  Medication Sig Dispense Refill Last Dose   aspirin EC 81 MG tablet Take 81 mg by mouth daily. Swallow whole.   11/23/2020   Fexofenadine HCl (ALLEGRA ALLERGY PO) Take  by mouth.   11/23/2020   loperamide (IMODIUM A-D) 2 MG capsule Take by mouth as needed for diarrhea or loose stools.   11/23/2020 at 1730   NIFEdipine (PROCARDIA-XL/ADALAT CC) 60 MG 24 hr tablet Take 1 tablet (60 mg total) by mouth daily. 30 tablet 1 11/23/2020   omeprazole (PRILOSEC) 20 MG capsule Take 20 mg by mouth daily.   11/23/2020   Prenatal Vit-Fe Fumarate-FA (PRENATAL MULTIVITAMIN) TABS tablet Take 1 tablet by mouth daily at 12 noon.   11/23/2020   Cetirizine HCl (ZYRTEC ALLERGY) 10 MG CAPS Zyrtec (Patient not taking: Reported on 08/12/2020)      Multiple Vitamins-Minerals (MULTIVITAMIN ADULT EXTRA C PO) multivitamin (Patient not taking: Reported on 08/12/2020)      oxybutynin (DITROPAN-XL) 5 MG 24 hr tablet oxybutynin chloride ER 5 mg tablet,extended release 24 hr (Patient not taking: Reported on 08/12/2020)      VITAMIN D PO Take by mouth.       Review of Systems   Constitutional:  Positive for fatigue.  Eyes:  Negative for photophobia and visual disturbance.  Gastrointestinal:  Positive for diarrhea.  Genitourinary:  Negative for vaginal bleeding.  Neurological:  Negative for headaches.  All other systems reviewed and are negative. Physical Exam   Blood pressure (!) 164/106, pulse 98, temperature 98.6 F (37 C), temperature source Oral, resp. rate 18, height 5\' 7"  (1.702 m), weight 92.1 kg, last menstrual period 03/30/2020, SpO2 100 %, unknown if currently breastfeeding.  Physical Exam Vitals and nursing note reviewed. Exam conducted with a chaperone present.  Constitutional:      Appearance: Normal appearance.  Cardiovascular:     Rate and Rhythm: Normal rate and regular rhythm.     Pulses: Normal pulses.     Heart sounds: Normal heart sounds.  Pulmonary:     Effort: Pulmonary effort is normal.     Breath sounds: Normal breath sounds.  Abdominal:     Comments: Gravid  Neurological:     Mental Status: She is alert.    MAU Course  Procedures  --Severe range BP prior to administration of outpatient Procardia XL 60 mg. PEC labs WNL --Reactive tracing: baseline 145, mod var, + accels, no decels --Toco: occasional contractions --Hx Term cesarean x 2 --Second severe range blood pressure obtained just prior to originally planned discharge. Dr. 04/01/2020 notified, patient evaluation and results reviewed. Per Dr. Henderson Cloud, admit to [facility]  Orders Placed This Encounter  Procedures   Wet prep, genital   Resp Panel by RT-PCR (Flu A&B, Covid) Nasopharyngeal Swab   Urinalysis, Routine w reflex microscopic Urine, Clean Catch   CBC   Comprehensive metabolic panel   Protein / creatinine ratio, urine   Fetal fibronectin   Notify physician (specify) Confirmatory reading of BP> 160/110 15 minutes later   Measure blood pressure   Airborne and Contact precautions   Patient Vitals for the past 24 hrs:  BP Temp Temp src Pulse Resp SpO2 Height Weight   11/24/20 1048 (!) 147/91 -- -- 93 -- -- -- --  11/24/20 1046 (!) 161/93 -- -- 92 -- -- -- --  11/24/20 1031 (!) 151/91 -- -- 89 -- -- -- --  11/24/20 1016 (!) 153/93 -- -- 89 -- -- -- --  11/24/20 1001 (!) 154/91 -- -- 88 -- -- -- --  11/24/20 0931 (!) 153/97 -- -- 87 -- -- -- --  11/24/20 0916 (!) 150/97 -- -- 96 -- -- -- --  11/24/20 0903 (!) 143/103 -- --  88 -- -- -- --  11/24/20 0853 (!) 148/91 -- -- 92 -- -- -- --  11/24/20 0838 (!) 164/106 -- -- 98 -- -- -- --  11/24/20 0814 (!) 155/95 98.6 F (37 C) Oral 94 18 100 % 5\' 7"  (1.702 m) 92.1 kg   Results for orders placed or performed during the hospital encounter of 11/24/20 (from the past 24 hour(s))  Urinalysis, Routine w reflex microscopic Urine, Clean Catch     Status: Abnormal   Collection Time: 11/24/20  8:21 AM  Result Value Ref Range   Color, Urine YELLOW YELLOW   APPearance CLOUDY (A) CLEAR   Specific Gravity, Urine 1.019 1.005 - 1.030   pH 6.0 5.0 - 8.0   Glucose, UA NEGATIVE NEGATIVE mg/dL   Hgb urine dipstick NEGATIVE NEGATIVE   Bilirubin Urine NEGATIVE NEGATIVE   Ketones, ur 20 (A) NEGATIVE mg/dL   Protein, ur 30 (A) NEGATIVE mg/dL   Nitrite NEGATIVE NEGATIVE   Leukocytes,Ua LARGE (A) NEGATIVE   RBC / HPF 6-10 0 - 5 RBC/hpf   WBC, UA 21-50 0 - 5 WBC/hpf   Bacteria, UA RARE (A) NONE SEEN   Squamous Epithelial / LPF 21-50 0 - 5   Trans Epithel, UA 2    Mucus PRESENT   Protein / creatinine ratio, urine     Status: Abnormal   Collection Time: 11/24/20  8:49 AM  Result Value Ref Range   Creatinine, Urine 171.76 mg/dL   Total Protein, Urine 39 mg/dL   Protein Creatinine Ratio 0.23 (H) 0.00 - 0.15 mg/mg[Cre]  CBC     Status: Abnormal   Collection Time: 11/24/20  9:04 AM  Result Value Ref Range   WBC 18.9 (H) 4.0 - 10.5 K/uL   RBC 4.64 3.87 - 5.11 MIL/uL   Hemoglobin 14.2 12.0 - 15.0 g/dL   HCT 11/26/20 16.1 - 09.6 %   MCV 88.6 80.0 - 100.0 fL   MCH 30.6 26.0 - 34.0 pg   MCHC 34.5 30.0 - 36.0 g/dL   RDW 04.5  40.9 - 81.1 %   Platelets 247 150 - 400 K/uL   nRBC 0.0 0.0 - 0.2 %  Comprehensive metabolic panel     Status: Abnormal   Collection Time: 11/24/20  9:04 AM  Result Value Ref Range   Sodium 133 (L) 135 - 145 mmol/L   Potassium 4.0 3.5 - 5.1 mmol/L   Chloride 104 98 - 111 mmol/L   CO2 19 (L) 22 - 32 mmol/L   Glucose, Bld 84 70 - 99 mg/dL   BUN 11 6 - 20 mg/dL   Creatinine, Ser 11/26/20 0.44 - 1.00 mg/dL   Calcium 9.5 8.9 - 7.82 mg/dL   Total Protein 7.0 6.5 - 8.1 g/dL   Albumin 2.7 (L) 3.5 - 5.0 g/dL   AST 25 15 - 41 U/L   ALT 19 0 - 44 U/L   Alkaline Phosphatase 188 (H) 38 - 126 U/L   Total Bilirubin 0.5 0.3 - 1.2 mg/dL   GFR, Estimated 95.6 >21 mL/min   Anion gap 10 5 - 15  Wet prep, genital     Status: Abnormal   Collection Time: 11/24/20  9:28 AM   Specimen: Cervix  Result Value Ref Range   Yeast Wet Prep HPF POC NONE SEEN NONE SEEN   Trich, Wet Prep NONE SEEN NONE SEEN   Clue Cells Wet Prep HPF POC NONE SEEN NONE SEEN   WBC, Wet Prep HPF POC MANY (A) NONE  SEEN   Sperm NONE SEEN      Assessment and Plan  --35 y.o. [id number] at 5435w1d  --Reactive tracing, closed cervix --Chronic HTN on Procardia 60 XL daily --Severe range BP x 2 in MAU --Enteric precautions as indicated by recurrent diarrhea --Per Dr. Henderson CloudHorvath, admit to [facility]  Reyana Leisey C Ragina Fenter, CNM 11/24/2020, 1:38 PM

## 2020-11-25 LAB — COMPREHENSIVE METABOLIC PANEL
ALT: 17 U/L (ref 0–44)
AST: 20 U/L (ref 15–41)
Albumin: 2.5 g/dL — ABNORMAL LOW (ref 3.5–5.0)
Alkaline Phosphatase: 152 U/L — ABNORMAL HIGH (ref 38–126)
Anion gap: 11 (ref 5–15)
BUN: 10 mg/dL (ref 6–20)
CO2: 19 mmol/L — ABNORMAL LOW (ref 22–32)
Calcium: 9.4 mg/dL (ref 8.9–10.3)
Chloride: 105 mmol/L (ref 98–111)
Creatinine, Ser: 0.75 mg/dL (ref 0.44–1.00)
GFR, Estimated: >60 mL/min (ref >60)
Glucose, Bld: 107 mg/dL — ABNORMAL HIGH (ref 70–99)
Potassium: 4.2 mmol/L (ref 3.5–5.1)
Sodium: 135 mmol/L (ref 135–145)
Total Bilirubin: 0.6 mg/dL (ref 0.3–1.2)
Total Protein: 6.3 g/dL — ABNORMAL LOW (ref 6.5–8.1)

## 2020-11-25 LAB — CULTURE, BETA STREP (GROUP B ONLY)

## 2020-11-25 LAB — CBC
HCT: 37.1 % (ref 36.0–46.0)
Hemoglobin: 13.1 g/dL (ref 12.0–15.0)
MCH: 30.7 pg (ref 26.0–34.0)
MCHC: 35.3 g/dL (ref 30.0–36.0)
MCV: 86.9 fL (ref 80.0–100.0)
Platelets: 243 10*3/uL (ref 150–400)
RBC: 4.27 MIL/uL (ref 3.87–5.11)
RDW: 13 % (ref 11.5–15.5)
WBC: 15 10*3/uL — ABNORMAL HIGH (ref 4.0–10.5)
nRBC: 0 % (ref 0.0–0.2)

## 2020-11-25 LAB — GC/CHLAMYDIA PROBE AMP (~~LOC~~) NOT AT ARMC
Chlamydia: NEGATIVE
Comment: NEGATIVE
Comment: NORMAL
Neisseria Gonorrhea: NEGATIVE

## 2020-11-25 LAB — PROTEIN / CREATININE RATIO, URINE
Creatinine, Urine: 106.6 mg/dL
Protein Creatinine Ratio: 0.2 mg/mg{Cre} — ABNORMAL HIGH (ref 0.00–0.15)
Total Protein, Urine: 21 mg/dL

## 2020-11-25 NOTE — Progress Notes (Signed)
Discharge instructions given to patient and reviewed. PT to follow up in office on Monday. EFM strip second reviewed with Henderson Newcomer, RN.

## 2020-11-25 NOTE — Discharge Summary (Signed)
Postpartum Discharge Summary      Patient Name: Nancy Bennett DOB: 12/15/1985 MRN: [id number]  Date of admission: 11/24/2020 Delivery date:This patient has no babies on file. Delivering provider: This patient has no babies on file. Date of discharge: 11/25/2020  Admitting diagnosis: CTX, Diarrhea Intrauterine pregnancy: [redacted]w[redacted]d     Secondary diagnosis:  Active Problems:   * No active hospital problems. *  Additional problems: Chronic hypertension    Discharge diagnosis: [facility] course: Admitted to antepartum for 23 hour observation for elevated blood pressures initially in the severe range. Received a single dose of labetalol IV and sis not require any additional antihypertensives over scheduled Procardia 60XL. Fetal well being reassuring.   BMZ received: Yes  Physical exam  Vitals:   11/24/20 1503 11/24/20 1936 11/24/20 2327 11/25/20 0459  BP: 128/77 132/83 127/85 138/84  Pulse: (!) 105 (!) 101 89 81  Resp:  18 18 16   Temp: 98.4 F (36.9 C) 98.2 F (36.8 C) 98.2 F (36.8 C) (!) 97.4 F (36.3 C)  TempSrc: Oral Oral Oral Oral  SpO2:  100% 100% 100%  Weight:      Height:       General: alert, cooperative, and no distress  Labs: Lab Results  Component Value Date   WBC 15.0 (H) 11/25/2020   HGB 13.1 11/25/2020   HCT 37.1 11/25/2020   MCV 86.9 11/25/2020   PLT 243 11/25/2020   CMP Latest Ref Rng & Units 11/25/2020  Glucose 70 - 99 mg/dL 11/27/2020)  BUN 6 - 20 mg/dL 10  Creatinine 505(L - 9.76 mg/dL 7.34  Sodium 1.93 - 790 mmol/L 135  Potassium 3.5 - 5.1 mmol/L 4.2  Chloride 98 - 111 mmol/L 105  CO2 22 - 32 mmol/L 19(L)  Calcium 8.9 - 10.3 mg/dL 9.4  Total Protein 6.5 - 8.1 g/dL 6.3(L)  Total Bilirubin 0.3 - 1.2 mg/dL 0.6  Alkaline Phos 38 - 126 U/L 152(H)  AST 15 - 41 U/L 20  ALT 0 - 44 U/L 17   Edinburgh Score: Edinburgh Postnatal Depression Scale Screening Tool 10/27/2017  I have been able to laugh and  see the funny side of things. 0  I have looked forward with enjoyment to things. 0  I have blamed myself unnecessarily when things went wrong. 2  I have been anxious or worried for no good reason. 1  I have felt scared or panicky for no good reason. 0  Things have been getting on top of me. 0  I have been so unhappy that I have had difficulty sleeping. 0  I have felt sad or miserable. 0  I have been so unhappy that I have been crying. 0  The thought of harming myself has occurred to me. 0  Edinburgh Postnatal Depression Scale Total 3      After visit meds:  Allergies as of 11/25/2020       Reactions   Cephalosporins Hives   Minocin [minocycline Hcl] Hives        Medication List     STOP taking these medications    acetaminophen 500 MG tablet Commonly known as: TYLENOL   aspirin EC 81 MG tablet   fexofenadine 60 MG  tablet Commonly known as: ALLEGRA   Imodium A-D 2 MG capsule Generic drug: loperamide   MULTIVITAMIN ADULT EXTRA C PO   omeprazole 20 MG capsule Commonly known as: PRILOSEC   oxybutynin 5 MG 24 hr tablet Commonly known as: DITROPAN-XL   prenatal multivitamin Tabs tablet   ZyrTEC Allergy 10 MG Caps Generic drug: Cetirizine HCl       TAKE these medications    NIFEdipine 60 MG 24 hr tablet Commonly known as: ADALAT CC Take 1 tablet (60 mg total) by mouth daily.         Discharge home in stable condition  Future Appointments:No future appointments. Follow up Visit:  Follow-up Information     Waynard Reeds, MD Follow up on 11/30/2020.   Specialty: Obstetrics and Gynecology Why: At 9:00 for an NST Contact information: [address] [place] [address] [phone]                     11/25/2020 Waynard Reeds, MD

## 2020-11-26 ENCOUNTER — Encounter: Payer: Self-pay | Admitting: Family Medicine

## 2020-11-26 DIAGNOSIS — O99820 Streptococcus B carrier state complicating pregnancy: Secondary | ICD-10-CM | POA: Insufficient documentation

## 2020-12-10 NOTE — Patient Instructions (Addendum)
Nancy Bennett  12/10/2020   Your procedure is scheduled on:  12/24/2020  Arrive at 1000 at Entrance C on [address] at [facility]  and [employer]. You are invited to use the FREE valet parking or use the Visitor's parking deck.  Pick up the phone at the desk and dial [phone].  Call this number if you have problems the morning of surgery: [phone]  Remember:   Do not eat food:(After Midnight) Desps de medianoche.  Do not drink clear liquids: (After Midnight) Desps de medianoche.  Take these medicines the morning of surgery with A SIP OF WATER:  Take procardia as prescribed   Do not wear jewelry, make-up or nail polish.  Do not wear lotions, powders, or perfumes. Do not wear deodorant.  Do not shave 48 hours prior to surgery.  Do not bring valuables to the hospital.  [facility] is not   responsible for any belongings or valuables brought to the hospital.  Contacts, dentures or bridgework may not be worn into surgery.  Leave suitcase in the car. After surgery it may be brought to your room.  For patients admitted to the hospital, checkout time is 11:00 AM the day of              discharge.      Please read over the following fact sheets that you were given:     Preparing for Surgery

## 2020-12-18 ENCOUNTER — Encounter (HOSPITAL_COMMUNITY): Payer: Self-pay

## 2020-12-22 ENCOUNTER — Other Ambulatory Visit: Payer: Self-pay | Admitting: Obstetrics and Gynecology

## 2020-12-22 ENCOUNTER — Encounter (HOSPITAL_COMMUNITY)
Admission: RE | Admit: 2020-12-22 | Discharge: 2020-12-22 | Disposition: A | Discharge status: Home / Self Care | Payer: 59 | Source: Ambulatory Visit | Attending: Obstetrics and Gynecology | Admitting: Obstetrics and Gynecology

## 2020-12-22 ENCOUNTER — Other Ambulatory Visit: Payer: Self-pay

## 2020-12-22 DIAGNOSIS — Z01812 Encounter for preprocedural laboratory examination: Secondary | ICD-10-CM | POA: Insufficient documentation

## 2020-12-22 HISTORY — DX: Other specified postprocedural states: Z98.890

## 2020-12-22 HISTORY — DX: Other specified postprocedural states: R11.2

## 2020-12-22 LAB — COMPREHENSIVE METABOLIC PANEL
ALT: 18 U/L (ref 0–44)
AST: 24 U/L (ref 15–41)
Albumin: 2.8 g/dL — ABNORMAL LOW (ref 3.5–5.0)
Alkaline Phosphatase: 200 U/L — ABNORMAL HIGH (ref 38–126)
Anion gap: 9 (ref 5–15)
BUN: 13 mg/dL (ref 6–20)
CO2: 20 mmol/L — ABNORMAL LOW (ref 22–32)
Calcium: 9 mg/dL (ref 8.9–10.3)
Chloride: 104 mmol/L (ref 98–111)
Creatinine, Ser: 0.81 mg/dL (ref 0.44–1.00)
GFR, Estimated: >60 mL/min (ref >60)
Glucose, Bld: 78 mg/dL (ref 70–99)
Potassium: 4.3 mmol/L (ref 3.5–5.1)
Sodium: 133 mmol/L — ABNORMAL LOW (ref 135–145)
Total Bilirubin: 0.5 mg/dL (ref 0.3–1.2)
Total Protein: 6.4 g/dL — ABNORMAL LOW (ref 6.5–8.1)

## 2020-12-22 LAB — SARS CORONAVIRUS 2 (TAT 6-24 HRS): SARS Coronavirus 2: NEGATIVE

## 2020-12-22 LAB — CBC WITH DIFFERENTIAL/PLATELET
Abs Immature Granulocytes: 0.05 10*3/uL (ref 0.00–0.07)
Basophils Absolute: 0 10*3/uL (ref 0.0–0.1)
Basophils Relative: 0 %
Eosinophils Absolute: 0.1 10*3/uL (ref 0.0–0.5)
Eosinophils Relative: 1 %
HCT: 41.4 % (ref 36.0–46.0)
Hemoglobin: 14.3 g/dL (ref 12.0–15.0)
Immature Granulocytes: 0 %
Lymphocytes Relative: 25 %
Lymphs Abs: 2.8 10*3/uL (ref 0.7–4.0)
MCH: 30.6 pg (ref 26.0–34.0)
MCHC: 34.5 g/dL (ref 30.0–36.0)
MCV: 88.7 fL (ref 80.0–100.0)
Monocytes Absolute: 0.9 10*3/uL (ref 0.1–1.0)
Monocytes Relative: 8 %
Neutro Abs: 7.7 10*3/uL (ref 1.7–7.7)
Neutrophils Relative %: 66 %
Platelets: 210 10*3/uL (ref 150–400)
RBC: 4.67 MIL/uL (ref 3.87–5.11)
RDW: 14 % (ref 11.5–15.5)
WBC: 11.5 10*3/uL — ABNORMAL HIGH (ref 4.0–10.5)
nRBC: 0 % (ref 0.0–0.2)

## 2020-12-23 LAB — RPR: RPR Ser Ql: NONREACTIVE

## 2020-12-24 ENCOUNTER — Encounter (HOSPITAL_COMMUNITY)
Admission: RE | Disposition: A | Discharge status: Home / Self Care | Payer: Self-pay | Source: Home / Self Care | Attending: Obstetrics and Gynecology

## 2020-12-24 ENCOUNTER — Inpatient Hospital Stay (HOSPITAL_COMMUNITY)
Admission: RE | Admit: 2020-12-24 | Discharge: 2020-12-27 | DRG: 784 | Disposition: A | Discharge status: Home / Self Care | Payer: 59 | Attending: Obstetrics and Gynecology | Admitting: Obstetrics and Gynecology

## 2020-12-24 ENCOUNTER — Inpatient Hospital Stay (HOSPITAL_COMMUNITY): Payer: 59 | Admitting: Anesthesiology

## 2020-12-24 ENCOUNTER — Encounter (HOSPITAL_COMMUNITY): Payer: Self-pay | Admitting: Obstetrics and Gynecology

## 2020-12-24 ENCOUNTER — Other Ambulatory Visit: Payer: Self-pay

## 2020-12-24 DIAGNOSIS — Z881 Allergy status to other antibiotic agents status: Secondary | ICD-10-CM

## 2020-12-24 DIAGNOSIS — Z302 Encounter for sterilization: Secondary | ICD-10-CM

## 2020-12-24 DIAGNOSIS — O34211 Maternal care for low transverse scar from previous cesarean delivery: Principal | ICD-10-CM | POA: Diagnosis present

## 2020-12-24 DIAGNOSIS — O1002 Pre-existing essential hypertension complicating childbirth: Secondary | ICD-10-CM | POA: Diagnosis present

## 2020-12-24 DIAGNOSIS — Z3A38 38 weeks gestation of pregnancy: Secondary | ICD-10-CM

## 2020-12-24 DIAGNOSIS — O99824 Streptococcus B carrier state complicating childbirth: Secondary | ICD-10-CM | POA: Diagnosis present

## 2020-12-24 DIAGNOSIS — O10919 Unspecified pre-existing hypertension complicating pregnancy, unspecified trimester: Secondary | ICD-10-CM | POA: Diagnosis present

## 2020-12-24 SURGERY — Surgical Case
Anesthesia: Spinal | Laterality: Bilateral | Wound class: Clean Contaminated

## 2020-12-24 MED ORDER — FENTANYL CITRATE (PF) 100 MCG/2ML IJ SOLN
INTRAMUSCULAR | Status: AC
  Filled 2020-12-24: qty 2

## 2020-12-24 MED ORDER — BUPIVACAINE IN DEXTROSE 0.75-8.25 % IT SOLN
INTRATHECAL | Status: DC | PRN
  Administered 2020-12-24: 1.6 mL via INTRATHECAL

## 2020-12-24 MED ORDER — NIFEDIPINE ER OSMOTIC RELEASE 30 MG PO TB24
60.0000 mg | ORAL_TABLET | Freq: Every day | ORAL | Status: DC
  Administered 2020-12-24 – 2020-12-26 (×3): 60 mg via ORAL
  Filled 2020-12-24 (×3): qty 2

## 2020-12-24 MED ORDER — CLINDAMYCIN PHOSPHATE 900 MG/50ML IV SOLN
900.0000 mg | INTRAVENOUS | Status: AC
  Administered 2020-12-24: 900 mg via INTRAVENOUS

## 2020-12-24 MED ORDER — OXYTOCIN-SODIUM CHLORIDE 30-0.9 UT/500ML-% IV SOLN: 2.5000 [IU]/h | INTRAVENOUS | Status: AC

## 2020-12-24 MED ORDER — DIPHENHYDRAMINE HCL 25 MG PO CAPS: 25.0000 mg | ORAL_CAPSULE | ORAL | Status: DC | PRN

## 2020-12-24 MED ORDER — DEXAMETHASONE SODIUM PHOSPHATE 4 MG/ML IJ SOLN
INTRAMUSCULAR | Status: DC | PRN
  Administered 2020-12-24: 10 mg via INTRAVENOUS

## 2020-12-24 MED ORDER — ACETAMINOPHEN 160 MG/5ML PO SOLN: 325.0000 mg | ORAL | Status: DC | PRN

## 2020-12-24 MED ORDER — PHENYLEPHRINE HCL-NACL 20-0.9 MG/250ML-% IV SOLN
INTRAVENOUS | Status: AC
  Filled 2020-12-24: qty 250

## 2020-12-24 MED ORDER — PHENYLEPHRINE HCL-NACL 20-0.9 MG/250ML-% IV SOLN
INTRAVENOUS | Status: DC | PRN
  Administered 2020-12-24: 60 ug/min via INTRAVENOUS

## 2020-12-24 MED ORDER — NALOXONE HCL 0.4 MG/ML IJ SOLN: 0.4000 mg | INTRAMUSCULAR | Status: DC | PRN

## 2020-12-24 MED ORDER — WITCH HAZEL-GLYCERIN EX PADS: 1.0000 "application " | MEDICATED_PAD | CUTANEOUS | Status: DC | PRN

## 2020-12-24 MED ORDER — OXYTOCIN-SODIUM CHLORIDE 30-0.9 UT/500ML-% IV SOLN
INTRAVENOUS | Status: AC
  Filled 2020-12-24: qty 500

## 2020-12-24 MED ORDER — SIMETHICONE 80 MG PO CHEW
80.0000 mg | CHEWABLE_TABLET | Freq: Three times a day (TID) | ORAL | Status: DC
  Administered 2020-12-24 – 2020-12-27 (×6): 80 mg via ORAL
  Filled 2020-12-24 (×6): qty 1

## 2020-12-24 MED ORDER — AMISULPRIDE (ANTIEMETIC) 5 MG/2ML IV SOLN: 10.0000 mg | Freq: Once | INTRAVENOUS | Status: DC | PRN

## 2020-12-24 MED ORDER — ONDANSETRON HCL 4 MG/2ML IJ SOLN
INTRAMUSCULAR | Status: AC
  Filled 2020-12-24: qty 2

## 2020-12-24 MED ORDER — LACTATED RINGERS IV SOLN: INTRAVENOUS | Status: DC

## 2020-12-24 MED ORDER — FLEET ENEMA 7-19 GM/118ML RE ENEM: 1.0000 | ENEMA | Freq: Every day | RECTAL | Status: DC | PRN

## 2020-12-24 MED ORDER — MORPHINE SULFATE (PF) 0.5 MG/ML IJ SOLN
INTRAMUSCULAR | Status: DC | PRN
  Administered 2020-12-24: .15 ug via INTRATHECAL

## 2020-12-24 MED ORDER — OXYCODONE HCL 5 MG PO TABS
5.0000 mg | ORAL_TABLET | ORAL | Status: DC | PRN
  Administered 2020-12-26 – 2020-12-27 (×3): 5 mg via ORAL
  Filled 2020-12-24 (×3): qty 1

## 2020-12-24 MED ORDER — DIPHENHYDRAMINE HCL 50 MG/ML IJ SOLN: 12.5000 mg | INTRAMUSCULAR | Status: DC | PRN

## 2020-12-24 MED ORDER — MEPERIDINE HCL 25 MG/ML IJ SOLN: 6.2500 mg | INTRAMUSCULAR | Status: DC | PRN

## 2020-12-24 MED ORDER — METOCLOPRAMIDE HCL 5 MG/ML IJ SOLN
INTRAMUSCULAR | Status: DC | PRN
  Administered 2020-12-24 (×2): 5 mg via INTRAVENOUS

## 2020-12-24 MED ORDER — SIMETHICONE 80 MG PO CHEW: 80.0000 mg | CHEWABLE_TABLET | ORAL | Status: DC | PRN

## 2020-12-24 MED ORDER — DIPHENHYDRAMINE HCL 25 MG PO CAPS: 25.0000 mg | ORAL_CAPSULE | Freq: Four times a day (QID) | ORAL | Status: DC | PRN

## 2020-12-24 MED ORDER — DEXAMETHASONE SODIUM PHOSPHATE 4 MG/ML IJ SOLN
INTRAMUSCULAR | Status: AC
  Filled 2020-12-24: qty 2

## 2020-12-24 MED ORDER — GENTAMICIN SULFATE 40 MG/ML IJ SOLN
400.0000 mg | INTRAVENOUS | Status: AC
  Administered 2020-12-24: 467.2 mg via INTRAVENOUS
  Filled 2020-12-24: qty 10

## 2020-12-24 MED ORDER — SOD CITRATE-CITRIC ACID 500-334 MG/5ML PO SOLN
30.0000 mL | ORAL | Status: AC
  Administered 2020-12-24: 30 mL via ORAL

## 2020-12-24 MED ORDER — NALBUPHINE HCL 10 MG/ML IJ SOLN: 5.0000 mg | INTRAMUSCULAR | Status: DC | PRN

## 2020-12-24 MED ORDER — OXYTOCIN-SODIUM CHLORIDE 30-0.9 UT/500ML-% IV SOLN
INTRAVENOUS | Status: DC | PRN
  Administered 2020-12-24: 30 [IU] via INTRAVENOUS

## 2020-12-24 MED ORDER — PROMETHAZINE HCL 25 MG/ML IJ SOLN
INTRAMUSCULAR | Status: AC
  Filled 2020-12-24: qty 1

## 2020-12-24 MED ORDER — BISACODYL 10 MG RE SUPP: 10.0000 mg | Freq: Every day | RECTAL | Status: DC | PRN

## 2020-12-24 MED ORDER — KETOROLAC TROMETHAMINE 30 MG/ML IJ SOLN: 30.0000 mg | Freq: Four times a day (QID) | INTRAMUSCULAR | Status: AC | PRN

## 2020-12-24 MED ORDER — PROMETHAZINE HCL 25 MG/ML IJ SOLN
6.2500 mg | INTRAMUSCULAR | Status: DC | PRN
  Administered 2020-12-24: 12.5 mg via INTRAVENOUS

## 2020-12-24 MED ORDER — MENTHOL 3 MG MT LOZG: 1.0000 | LOZENGE | OROMUCOSAL | Status: DC | PRN

## 2020-12-24 MED ORDER — ACETAMINOPHEN 325 MG PO TABS: 325.0000 mg | ORAL_TABLET | ORAL | Status: DC | PRN

## 2020-12-24 MED ORDER — FENTANYL CITRATE (PF) 100 MCG/2ML IJ SOLN: 25.0000 ug | INTRAMUSCULAR | Status: DC | PRN

## 2020-12-24 MED ORDER — MORPHINE SULFATE (PF) 0.5 MG/ML IJ SOLN
INTRAMUSCULAR | Status: AC
  Filled 2020-12-24: qty 10

## 2020-12-24 MED ORDER — COCONUT OIL OIL: 1.0000 "application " | TOPICAL_OIL | Status: DC | PRN

## 2020-12-24 MED ORDER — NALBUPHINE HCL 10 MG/ML IJ SOLN
5.0000 mg | Freq: Once | INTRAMUSCULAR | Status: DC | PRN
Start: 2020-12-24 — End: 2020-12-27

## 2020-12-24 MED ORDER — SCOPOLAMINE 1 MG/3DAYS TD PT72
MEDICATED_PATCH | TRANSDERMAL | Status: AC
  Filled 2020-12-24: qty 1

## 2020-12-24 MED ORDER — KETOROLAC TROMETHAMINE 30 MG/ML IJ SOLN
30.0000 mg | Freq: Four times a day (QID) | INTRAMUSCULAR | Status: AC | PRN
  Administered 2020-12-24 – 2020-12-25 (×2): 30 mg via INTRAVENOUS

## 2020-12-24 MED ORDER — STERILE WATER FOR IRRIGATION IR SOLN
Status: DC | PRN
  Administered 2020-12-24: 1000 mL

## 2020-12-24 MED ORDER — SCOPOLAMINE 1 MG/3DAYS TD PT72
1.0000 | MEDICATED_PATCH | Freq: Once | TRANSDERMAL | Status: AC
  Administered 2020-12-24: 1.5 mg via TRANSDERMAL

## 2020-12-24 MED ORDER — NALOXONE HCL 4 MG/10ML IJ SOLN
1.0000 ug/kg/h | INTRAVENOUS | Status: DC | PRN
  Filled 2020-12-24: qty 5

## 2020-12-24 MED ORDER — KETOROLAC TROMETHAMINE 30 MG/ML IJ SOLN
INTRAMUSCULAR | Status: AC
  Filled 2020-12-24: qty 1

## 2020-12-24 MED ORDER — ONDANSETRON HCL 4 MG/2ML IJ SOLN: 4.0000 mg | Freq: Three times a day (TID) | INTRAMUSCULAR | Status: DC | PRN

## 2020-12-24 MED ORDER — SODIUM CHLORIDE 0.9 % IR SOLN
Status: DC | PRN
  Administered 2020-12-24: 1000 mL

## 2020-12-24 MED ORDER — CLINDAMYCIN PHOSPHATE 900 MG/50ML IV SOLN
INTRAVENOUS | Status: AC
  Filled 2020-12-24: qty 50

## 2020-12-24 MED ORDER — SENNOSIDES-DOCUSATE SODIUM 8.6-50 MG PO TABS
2.0000 | ORAL_TABLET | ORAL | Status: DC
  Administered 2020-12-25 – 2020-12-27 (×3): 2 via ORAL
  Filled 2020-12-24 (×3): qty 2

## 2020-12-24 MED ORDER — ONDANSETRON HCL 4 MG/2ML IJ SOLN
INTRAMUSCULAR | Status: DC | PRN
  Administered 2020-12-24: 4 mg via INTRAVENOUS

## 2020-12-24 MED ORDER — KETOROLAC TROMETHAMINE 30 MG/ML IJ SOLN
30.0000 mg | Freq: Four times a day (QID) | INTRAMUSCULAR | Status: AC
  Administered 2020-12-24 – 2020-12-25 (×3): 30 mg via INTRAVENOUS
  Filled 2020-12-24 (×4): qty 1

## 2020-12-24 MED ORDER — PHENYLEPHRINE 40 MCG/ML (10ML) SYRINGE FOR IV PUSH (FOR BLOOD PRESSURE SUPPORT)
PREFILLED_SYRINGE | INTRAVENOUS | Status: AC
  Filled 2020-12-24: qty 10

## 2020-12-24 MED ORDER — ACETAMINOPHEN 10 MG/ML IV SOLN
INTRAVENOUS | Status: AC
  Filled 2020-12-24: qty 100

## 2020-12-24 MED ORDER — SODIUM CHLORIDE 0.9% FLUSH: 3.0000 mL | INTRAVENOUS | Status: DC | PRN

## 2020-12-24 MED ORDER — METOCLOPRAMIDE HCL 5 MG/ML IJ SOLN
INTRAMUSCULAR | Status: AC
  Filled 2020-12-24: qty 2

## 2020-12-24 MED ORDER — ACETAMINOPHEN 500 MG PO TABS
1000.0000 mg | ORAL_TABLET | Freq: Four times a day (QID) | ORAL | Status: DC
  Administered 2020-12-24 – 2020-12-27 (×10): 1000 mg via ORAL
  Filled 2020-12-24 (×10): qty 2

## 2020-12-24 MED ORDER — SOD CITRATE-CITRIC ACID 500-334 MG/5ML PO SOLN
ORAL | Status: AC
  Filled 2020-12-24: qty 30

## 2020-12-24 MED ORDER — DIBUCAINE (PERIANAL) 1 % EX OINT: 1.0000 "application " | TOPICAL_OINTMENT | CUTANEOUS | Status: DC | PRN

## 2020-12-24 MED ORDER — FENTANYL CITRATE (PF) 100 MCG/2ML IJ SOLN
INTRAMUSCULAR | Status: DC | PRN
  Administered 2020-12-24: 15 ug via INTRATHECAL

## 2020-12-24 MED ORDER — TETANUS-DIPHTH-ACELL PERTUSSIS 5-2.5-18.5 LF-MCG/0.5 IM SUSY: 0.5000 mL | PREFILLED_SYRINGE | Freq: Once | INTRAMUSCULAR | Status: DC

## 2020-12-24 MED ORDER — PRENATAL MULTIVITAMIN CH
1.0000 | ORAL_TABLET | Freq: Every day | ORAL | Status: DC
  Administered 2020-12-25 – 2020-12-27 (×3): 1 via ORAL
  Filled 2020-12-24 (×3): qty 1

## 2020-12-24 MED ORDER — ACETAMINOPHEN 10 MG/ML IV SOLN
1000.0000 mg | Freq: Once | INTRAVENOUS | Status: DC | PRN
  Administered 2020-12-24: 1000 mg via INTRAVENOUS

## 2020-12-24 MED ORDER — HYDROMORPHONE HCL 1 MG/ML IJ SOLN: 0.2000 mg | INTRAMUSCULAR | Status: DC | PRN

## 2020-12-24 MED ORDER — IBUPROFEN 600 MG PO TABS
600.0000 mg | ORAL_TABLET | Freq: Four times a day (QID) | ORAL | Status: DC
  Administered 2020-12-25 – 2020-12-27 (×6): 600 mg via ORAL
  Filled 2020-12-24 (×6): qty 1

## 2020-12-24 SURGICAL SUPPLY — 37 items
BENZOIN TINCTURE PRP APPL 2/3 (GAUZE/BANDAGES/DRESSINGS) ×2 IMPLANT
CLAMP CORD UMBIL (MISCELLANEOUS) IMPLANT
CLIP FILSHIE TUBAL LIGA STRL (Clip) ×1 IMPLANT
CLOSURE STERI STRIP 1/2 X4 (GAUZE/BANDAGES/DRESSINGS) ×2 IMPLANT
CLOTH BEACON ORANGE TIMEOUT ST (SAFETY) ×2 IMPLANT
DRSG OPSITE POSTOP 4X10 (GAUZE/BANDAGES/DRESSINGS) ×2 IMPLANT
ELECT REM PT RETURN 9FT ADLT (ELECTROSURGICAL) ×2
ELECTRODE REM PT RTRN 9FT ADLT (ELECTROSURGICAL) ×1 IMPLANT
EXTRACTOR VACUUM BELL STYLE (SUCTIONS) IMPLANT
GAUZE SPONGE 4X4 12PLY STRL LF (GAUZE/BANDAGES/DRESSINGS) ×4 IMPLANT
GLOVE BIOGEL PI IND STRL 7.0 (GLOVE) ×2 IMPLANT
GLOVE BIOGEL PI INDICATOR 7.0 (GLOVE) ×2
GLOVE SURG LTX SZ6 (GLOVE) ×2 IMPLANT
GLOVE SURG UNDER POLY LF SZ6.5 (GLOVE) ×2 IMPLANT
GOWN STRL REUS W/TWL LRG LVL3 (GOWN DISPOSABLE) ×4 IMPLANT
HEMOSTAT SURGICEL 2X3 (HEMOSTASIS) ×2 IMPLANT
KIT ABG SYR 3ML LUER SLIP (SYRINGE) IMPLANT
LIGASURE IMPACT 36 18CM CVD LR (INSTRUMENTS) ×2 IMPLANT
NEEDLE HYPO 25X5/8 SAFETYGLIDE (NEEDLE) IMPLANT
NS IRRIG 1000ML POUR BTL (IV SOLUTION) ×2 IMPLANT
PACK C SECTION WH (CUSTOM PROCEDURE TRAY) ×2 IMPLANT
PAD ABD 7.5X8 STRL (GAUZE/BANDAGES/DRESSINGS) ×2 IMPLANT
PAD OB MATERNITY 4.3X12.25 (PERSONAL CARE ITEMS) ×2 IMPLANT
PENCIL SMOKE EVAC W/HOLSTER (ELECTROSURGICAL) ×2 IMPLANT
RTRCTR C-SECT PINK 25CM LRG (MISCELLANEOUS) ×2 IMPLANT
STRIP CLOSURE SKIN 1/2X4 (GAUZE/BANDAGES/DRESSINGS) ×2 IMPLANT
SUT MNCRL 0 VIOLET CTX 36 (SUTURE) ×2 IMPLANT
SUT MONOCRYL 0 CTX 36 (SUTURE) ×4
SUT VIC AB 0 CT1 36 (SUTURE) ×4 IMPLANT
SUT VIC AB 3-0 CT1 27 (SUTURE) ×2
SUT VIC AB 3-0 CT1 TAPERPNT 27 (SUTURE) ×1 IMPLANT
SUT VIC AB 3-0 SH 27 (SUTURE) ×2
SUT VIC AB 3-0 SH 27X BRD (SUTURE) ×1 IMPLANT
SUT VIC AB 4-0 KS 27 (SUTURE) ×2 IMPLANT
TOWEL OR 17X24 6PK STRL BLUE (TOWEL DISPOSABLE) ×2 IMPLANT
TRAY FOLEY W/BAG SLVR 14FR LF (SET/KITS/TRAYS/PACK) ×2 IMPLANT
WATER STERILE IRR 1000ML POUR (IV SOLUTION) ×2 IMPLANT

## 2020-12-24 NOTE — Anesthesia Postprocedure Evaluation (Signed)
Anesthesia Post Note  Patient: Crimson Dubberly Min  Procedure(s) Performed: CESAREAN SECTION WITH BILATERAL Salpingectomy (Bilateral)     Patient location during evaluation: PACU Anesthesia Type: Spinal Level of consciousness: oriented and awake and alert Pain management: pain level controlled Vital Signs Assessment: post-procedure vital signs reviewed and stable Respiratory status: spontaneous breathing, respiratory function stable and patient connected to nasal cannula oxygen Cardiovascular status: blood pressure returned to baseline and stable Postop Assessment: no headache, no backache and no apparent nausea or vomiting Anesthetic complications: no   No notable events documented.  Last Vitals:  Vitals:   12/24/20 1430 12/24/20 1437  BP: (!) 126/95 (!) 139/96  Pulse: (!) 53 (!) 51  Resp: 14 16  Temp:  (!) 36.4 C  SpO2: 95% 96%    Last Pain:  Vitals:   12/24/20 1437  TempSrc: Oral   Pain Goal:    LLE Motor Response: Purposeful movement (12/24/20 1430)   RLE Motor Response: Purposeful movement (12/24/20 1430)       Epidural/Spinal Function Cutaneous sensation: Able to Discern Pressure (12/24/20 1430), Patient able to flex knees: Yes (12/24/20 1430), Patient able to lift hips off bed: No (12/24/20 1430), Back pain beyond tenderness at insertion site: No (12/24/20 1430), Progressively worsening motor and/or sensory loss: No (12/24/20 1430), Bowel and/or bladder incontinence post epidural: No (12/24/20 1430)  Shelton Silvas

## 2020-12-24 NOTE — H&P (Signed)
Nancy Bennett is a 35 y.o. female [phone] [redacted]w[redacted]d presenting for scheduled repeat cesarean and bilateral salpingectomy. She reports no LOF, VB, Contractions. Normal FM.  No headache, vision changes, RUQ pain.   Pregnancy c/b: Chronic hypertension: on Procardia XL 60mg  daily History of cesarean x 2  OB History     Gravida  4   Para  2   Term  2   Preterm      AB  1   Living  2      SAB  0   IAB  1   Ectopic      Multiple  0   Live Births  2          Past Medical History:  Diagnosis Date   Anxiety    Anxiety    Depression    PONV (postoperative nausea and vomiting)    n&v after last CS   Pregnancy induced hypertension    Past Surgical History:  Procedure Laterality Date   CESAREAN SECTION N/A 06/11/2015   Procedure: CESAREAN SECTION;  Surgeon: 08/11/2015, MD;  Location: [facility];  Service: Obstetrics;  Laterality: N/A;   CESAREAN SECTION N/A 10/27/2017   Procedure: REPEAT CESAREAN SECTION;  Surgeon: 10/29/2017, MD;  Location: [facility] BIRTHING SUITES;  Service: Obstetrics;  Laterality: N/A;  Heather, RNFA   exploratory bladder     surgery as a child   TONSILLECTOMY     WISDOM TOOTH EXTRACTION     Family History: family history includes Asthma in her mother; Glaucoma in her mother; Heart disease in her paternal grandfather; Hemophilia in her paternal grandfather; Hypertension in her father; Seizures in her father. Social History:  reports that she has never smoked. She has never used smokeless tobacco. She reports that she does not currently use alcohol after a past usage of about 2.0 standard drinks per week. She reports that she does not use drugs.     Maternal Diabetes: No Genetic Screening: Normal Maternal Ultrasounds/Referrals: Normal Fetal Ultrasounds or other Referrals:  Referred to Materal Fetal Medicine  Maternal Substance Abuse:  No Significant Maternal Medications:  Meds include: Other:  Procardia 60 Significant Maternal Lab Results:  Group B Strep  positive Other Comments:  None  Review of Systems Per HPI Exam Physical Exam    Blood pressure (!) 159/109, pulse 66, temperature 98.6 F (37 C), temperature source Oral, resp. rate 18, height 5\' 7"  (1.702 m), weight 93.4 kg, last menstrual period 03/30/2020, SpO2 100 %, unknown if currently breastfeeding. Gen:NAD, resting comfortably CVS: RRR Lungs: CTAB Abd: Gravid abdomen Ext: no calf edema or tenderness   Prenatal labs: ABO, Rh:  --/--/O POS (09/20 0912) Antibody: POS (09/20 0912) Rubella: Immune (03/08 0000) RPR: NON REACTIVE (09/20 0913)  HBsAg: Negative (03/08 0000)  HIV: Non-reactive (03/08 0000)  GBS:   positive  Assessment/Plan: 34Y 10-22-1988 @ [redacted]w[redacted]d, scheduled repeat cesarean and bilateral salpingectomy Informed consent obtained. Reviewed risks of infection, bleeding, damage to surrounding organs, blood transfusion, hysterectomy, inability to safely complete salpingectomy. All questions answered Hypertension, chronic: nonsevere elevated Bps on admission, continue Procardia 60 and will monitor Bps and adjust antihypertensive as needed Allergy to cephalosporins: gentamicin/clindamycin for infection prophylaxis SCDs, foley   [id number] 12/24/2020, 11:34 AM

## 2020-12-24 NOTE — Anesthesia Preprocedure Evaluation (Addendum)
Anesthesia Evaluation  Patient identified by MRN, date of birth, ID band Patient awake    Reviewed: Allergy & Precautions, NPO status , Patient's Chart, lab work & pertinent test results  History of Anesthesia Complications (+) PONV and history of anesthetic complications  Airway Mallampati: II  TM Distance: >3 FB Neck ROM: Full    Dental  (+) Teeth Intact, Dental Advisory Given   Pulmonary neg pulmonary ROS,    Pulmonary exam normal        Cardiovascular hypertension,  Rhythm:Regular Rate:Normal     Neuro/Psych PSYCHIATRIC DISORDERS Anxiety Depression negative neurological ROS     GI/Hepatic Neg liver ROS, GERD  Medicated,  Endo/Other  negative endocrine ROS  Renal/GU negative Renal ROS     Musculoskeletal   Abdominal Normal abdominal exam  (+)   Peds  Hematology negative hematology ROS (+)   Anesthesia Other Findings   Reproductive/Obstetrics (+) Pregnancy                            Anesthesia Physical Anesthesia Plan  ASA: 2  Anesthesia Plan: Spinal   Post-op Pain Management:    Induction:   PONV Risk Score and Plan: 0 and Ondansetron  Airway Management Planned: Natural Airway  Additional Equipment: None  Intra-op Plan:   Post-operative Plan:   Informed Consent: I have reviewed the patients History and Physical, chart, labs and discussed the procedure including the risks, benefits and alternatives for the proposed anesthesia with the patient or authorized representative who has indicated his/her understanding and acceptance.       Plan Discussed with: CRNA  Anesthesia Plan Comments: (Lab Results      Component                Value               Date                      WBC                      11.5 (H)            12/22/2020                HGB                      14.3                12/22/2020                HCT                      41.4                 12/22/2020                MCV                      88.7                12/22/2020                PLT                      210                 12/22/2020           )  Anesthesia Quick Evaluation  

## 2020-12-24 NOTE — Progress Notes (Signed)
Dr. Vonna Kotyk on call, not Timothy Lasso, reported elevated BPs and informed patient took Procardia at 1620 and is currently asymptomatic. Dr. Vonna Kotyk states she will continue to floow patient's BP and RN to call for any concerns. Due to patient recently have taken Procardia, MD does not have further orders at this time.

## 2020-12-24 NOTE — Transfer of Care (Signed)
Immediate Anesthesia Transfer of Care Note  Patient: Nancy Bennett  Procedure(s) Performed: CESAREAN SECTION WITH BILATERAL Salpingectomy (Bilateral)  Patient Location: PACU  Anesthesia Type:Spinal  Level of Consciousness: awake, alert  and oriented  Airway & Oxygen Therapy: Patient Spontanous Breathing  Post-op Assessment: Report given to RN and Post -op Vital signs reviewed and stable  Post vital signs: Reviewed and stable  Last Vitals:  Vitals Value Taken Time  BP 146/103 12/24/20 1332  Temp    Pulse 76 12/24/20 1337  Resp 14 12/24/20 1337  SpO2 98 % 12/24/20 1337  Vitals shown include unvalidated device data.  Last Pain:  Vitals:   12/24/20 1110  TempSrc: Oral         Complications: No notable events documented.

## 2020-12-24 NOTE — Progress Notes (Signed)
Patient had elevated blood pressure upon admission to [facility] from PACU, Charlene Brooke, RN (PACU RN at hand off) reports Dr. Timothy Lasso is aware of elevated blood pressures in recovery and no additional orders were obtained. Patient continues to have elevated BPs,135/94, asymptomatic, denies headache, denies blurred vision and Procardia 60 mg po was given per post op orders. Repeat blood pressure 145/93. Dr. Timothy Lasso called via vocera and message left to call RN due to elevated BP.

## 2020-12-24 NOTE — Op Note (Addendum)
CESAREAN SECTION Procedure Note  Patient: Nancy Bennett is a 35 y.o. [id number] @ [redacted]w[redacted]d  Preoperative Diagnosis:  Intrauterine pregnancy at 38 weeks 3 days History of cesarean times two Chronic hypertension Desire for permanent sterilization  Postoperative Diagnosis: same, delivered  Procedure: Repeat low transverse cesarean, bilateral salpingectomy  Assistant: Philip Aspen, DO     Surgeon: Charlett Nose , MD  Anesthesia: Spinal anesthesia   Findings: Normal appearing uterus, fallopian tubes bilaterally, and ovaries bilaterally. Filmy adhesions from bladder to uterus. Extremely thin lower uterine segment. Viable female infant in vertex presentation delivered at 1222 with weight pending,  Apgars 9 and 9.  Estimated Blood Loss:          Specimens: Bilateral fallopian tubes to pathology; Placenta to L&D for disposal         Complications:  None         Disposition: PACU - hemodynamically stable.         Condition: stable    Description of Procedure: The patient was taken to the operating room where spinal anesthesia was placed and found to be adequate.  The patient was placed in the dorsal supine position.  Fetal heart tones were confirmed. Thromboguards were applied and cycling. A foley catheter was inserted and draining. Clindamycin 900mg  and Gentamicin 400mg  were given for infection prophylaxis. The patient was subsequently prepped and draped in the normal sterile fashion.    The previous Pfannenstiel scar was removed with the scalpel and Bovie. The incision was carried down to the level of the fascia with the Bovie.  The fascia was incised in the midline with the scalpel and extended laterally with curved Mayo scissors.  Kocher clamps were applied to the inferior fascial edge and the fascia was dissected off the rectus muscle sharply using the Mayo scissors.  The Kocher clamps were transferred to the superior fascial edge and the underlying rectus muscle was dissected  off with curved Mayo's scissors.  The rectus muscles then were separated in the midline.  The peritoneum was found free of adherent bowel and the peritoneal cavity was entered with Metzenbaum scissors.  The uterus was identified and the alexis retractor was placed intraperitoneal.  A bladder flap was then created sharply with Metzenbaum scissors and separated from the lower uterine segment digitally.   A low transverse hysterotomy was then made with a scalpel.  The infant was found in the vertex presentation was delivered atraumatically and without difficulty with standard maneuvers. A loose nuchal cord was reduced.  After 60 seconds of delayed cord clamping the cord was clamped and cut and the infant was handed off to the pediatricians.  The placenta was delivered with gentle traction on umbilical cord and manual massage of the uterine fundus.  The uterus was cleared of all clot and debris.  An vertical extension was noted from the right hysterotomy angle. The lower uterine segment was noted to be extremely thin. The horizontal portion of the hysterotomy was closed with 0 monocryl in a running locked fashion. Then the right hysterotomy extension was closed with 0 monocryl in a running locked fashion. Figure of 8 stitches of 3-0 vicryl were used to achieve hemostasis at the inferior portion of the hysterotomy. The hysterotomy was found to be hemostatic.   Attention was turned to the right fallopian tube, which was grasped and serially ligated with the Ligasure device until it was completely freed. The same was repeated on the left side. Hemostasis was noted at bilateral salpingectomy  sites. The fallopian tubes were sent for pathology.   The rectus muscle was inspected and hemostasis achieved with 3-0 vicryl in figure of 8 stitches. A piece of Surgicel was applied to the lower rectus and hemostasis was appreciated.The fascia was closed with a 0 Vicryl suture in a continuous running fashion.  The subcutaneous  tissue was irrigated and rendered hemostatic with cautery.  The subcutaneous layer was subsequently closed with 3-0 Vicryl in a continuous running fashion.  The skin was closed with 4-0 vicryl  in a running subcuticular fashion.  Sponge, lap and needle counts were correct. Steri strips and a Honeycomb dressing were placed on the incision and a pressure dressing was applied  Charlett Nose 12/24/20  1:26 PM

## 2020-12-24 NOTE — Anesthesia Procedure Notes (Signed)
Spinal  Start time: 12/24/2020 11:56 AM End time: 12/24/2020 11:58 AM Reason for block: surgical anesthesia Staffing Performed: anesthesiologist  Anesthesiologist: Shelton Silvas, MD Preanesthetic Checklist Completed: patient identified, IV checked, site marked, risks and benefits discussed, surgical consent, monitors and equipment checked, pre-op evaluation and timeout performed Spinal Block Patient position: sitting Prep: DuraPrep and site prepped and draped Location: L3-4 Injection technique: single-shot Needle Needle type: Pencan  Needle gauge: 24 G Needle length: 10 cm Needle insertion depth: 10 cm Assessment Events: CSF return Additional Notes Patient tolerated well. No immediate complications.

## 2020-12-25 LAB — TYPE AND SCREEN
ABO/RH(D): O POS
Antibody Screen: POSITIVE
Donor AG Type: NEGATIVE
Donor AG Type: NEGATIVE
Unit division: 0
Unit division: 0

## 2020-12-25 LAB — BPAM RBC
Blood Product Expiration Date: 202210082359
Blood Product Expiration Date: 202210222359
Unit Type and Rh: 5100
Unit Type and Rh: 5100

## 2020-12-25 LAB — CBC
HCT: 39.1 % (ref 36.0–46.0)
Hemoglobin: 13.2 g/dL (ref 12.0–15.0)
MCH: 30.3 pg (ref 26.0–34.0)
MCHC: 33.8 g/dL (ref 30.0–36.0)
MCV: 89.9 fL (ref 80.0–100.0)
Platelets: 219 10*3/uL (ref 150–400)
RBC: 4.35 MIL/uL (ref 3.87–5.11)
RDW: 14.1 % (ref 11.5–15.5)
WBC: 23.9 10*3/uL — ABNORMAL HIGH (ref 4.0–10.5)
nRBC: 0 % (ref 0.0–0.2)

## 2020-12-25 LAB — BIRTH TISSUE RECOVERY COLLECTION (PLACENTA DONATION)

## 2020-12-25 NOTE — Progress Notes (Signed)
MOB was referred for history of depression/anxiety. * Referral screened out by Clinical Social Worker because none of the following criteria appear to apply: ~ History of anxiety/depression during this pregnancy, or of post-partum depression following prior delivery. Per prenatal records review, no concerns noted.  ~ Diagnosis of anxiety and/or depression within last 3 years. Per chart review, MOB's anxiety dates back to 2013 and depression dates back to 2011.  OR * MOB's symptoms currently being treated with medication and/or therapy. Please contact the Clinical Social Worker if needs arise, by MOB request, or if MOB scores greater than 9/yes to question 10 on Edinburgh Postpartum Depression Screen.  Rhylynn Perdomo, LCSW Clinical Social Worker [facility] Cell#: [phone] 

## 2020-12-25 NOTE — Progress Notes (Signed)
Subjective: Postpartum Day #1: Cesarean Delivery Patient reports incisional pain, tolerating PO, and no problems voiding.    Objective: Vital signs in last 24 hours: Temp:  [97.5 F (36.4 C)-98.6 F (37 C)] 98.2 F (36.8 C) (09/23 0625) Pulse Rate:  [51-77] 60 (09/23 0625) Resp:  [11-18] 18 (09/23 0625) BP: (121-159)/(79-109) 121/79 (09/23 0625) SpO2:  [94 %-100 %] 99 % (09/23 0625) Weight:  [93.4 kg] 93.4 kg (09/22 1007)  Physical Exam:  General: alert Lochia: appropriate Uterine Fundus: firm Incision: pressure dressing C/D/I  Recent Labs    12/22/20 0913 12/25/20 0511  HGB 14.3 13.2  HCT 41.4 39.1    Assessment/Plan: Status post Cesarean section. Doing well postoperatively. BP well controlled so far on Procardia Continue current care, ambulate, monitor BP.  Leighton Roach Tywon Niday 12/25/2020, 9:06 AM

## 2020-12-26 MED ORDER — NIFEDIPINE ER OSMOTIC RELEASE 30 MG PO TB24
90.0000 mg | ORAL_TABLET | Freq: Every day | ORAL | Status: DC
  Administered 2020-12-27: 90 mg via ORAL
  Filled 2020-12-26: qty 3

## 2020-12-26 MED ORDER — NIFEDIPINE ER OSMOTIC RELEASE 30 MG PO TB24
30.0000 mg | ORAL_TABLET | Freq: Once | ORAL | Status: AC
  Administered 2020-12-26: 30 mg via ORAL
  Filled 2020-12-26: qty 1

## 2020-12-26 NOTE — Progress Notes (Signed)
  Patient is eating, ambulating, voiding.  Pain control is good.  Vitals:   12/25/20 1111 12/25/20 1419 12/25/20 2023 12/26/20 0548  BP: 132/83 131/88 132/88 (!) 148/99  Pulse:  67 71 63  Resp:  18 18 18   Temp:  98.3 F (36.8 C) 97.8 F (36.6 C) 98.6 F (37 C)  TempSrc:  Oral Oral Oral  SpO2:  99% 98% 99%  Weight:      Height:        lungs:   clear to auscultation cor:    RRR Abdomen:  soft, appropriate tenderness, incisions intact and without erythema or exudate ex:    no cords   Lab Results  Component Value Date   WBC 23.9 (H) 12/25/2020   HGB 13.2 12/25/2020   HCT 39.1 12/25/2020   MCV 89.9 12/25/2020   PLT 219 12/25/2020    --/--/O POS (09/20 0912)/RI  A/P    Post operative day 2. CHTN with elevated BP this am.  Previously BPs on 60 mg XL had been excellent; this am however had 148/99.  Keep one more day to check BPs   Routine post op and postpartum care.  Expect d/c tomorrow. Percocet for pain control.

## 2020-12-26 NOTE — Progress Notes (Signed)
Vitals:   12/25/20 2023 12/26/20 0548 12/26/20 1247 12/26/20 1600  BP: 132/88 (!) 148/99 (!) 151/99 (!) 145/98  Pulse: 71 63 65 89  Resp: 18 18    Temp: 97.8 F (36.6 C) 98.6 F (37 C) 98.1 F (36.7 C)   TempSrc: Oral Oral Oral   SpO2: 98% 99% 99%   Weight:      Height:       BPs still elevated.  Added 30 mg Procardia XL now and increased Procardia to 90 mg XL tomorrow am.  Pt to stay for further eval of BPS overnight.

## 2020-12-26 NOTE — Progress Notes (Signed)
Surgical drsg 3/4 saturated with old drainage/she stated MD had told her to take that off tomorrow at home/so will not call MD for orders to change

## 2020-12-27 MED ORDER — LABETALOL HCL 200 MG PO TABS: 200.0000 mg | ORAL_TABLET | Freq: Two times a day (BID) | ORAL | 4 refills | Status: AC

## 2020-12-27 MED ORDER — LABETALOL HCL 200 MG PO TABS
200.0000 mg | ORAL_TABLET | Freq: Two times a day (BID) | ORAL | Status: DC
  Administered 2020-12-27: 200 mg via ORAL
  Filled 2020-12-27: qty 1

## 2020-12-27 MED ORDER — OXYCODONE HCL 5 MG PO TABS: 5.0000 mg | ORAL_TABLET | ORAL | 0 refills | Status: AC | PRN

## 2020-12-27 MED ORDER — NIFEDIPINE ER OSMOTIC RELEASE 90 MG PO TB24: 90.0000 mg | ORAL_TABLET | Freq: Every day | ORAL | 4 refills | Status: AC

## 2020-12-27 NOTE — Lactation Note (Signed)
This note was copied from a baby's chart. Lactation Consultation Note  Patient Name: Nancy Bennett [id number] Date: 12/27/2020 Reason for consult: Initial assessment;Early term 37-38.6wks Age:35 hours   P3 mother whose infant is now 5 hours old.  This is an ETI at 38+3 weeks.  Mother has breast feeding experience.  She is supplementing with formula.  Lactation consult not entered until 0911 this morning.    Mother has been breast feeding without difficulty.  LATCH scores of 9; baby is voiding/stooling well.  Reviewed breast feeding basics.  Mother's nipples are sensitive but no breakdown.  Coconut oil with instructions for use given.    Family desires a discharge today; discharge is being held until later afternoon at the earliest due to BP concerns with mother.  Parents may have issues with child care if mother is not discharged today and this is concerning to them.    Offered to return for a latch observation if mother desires.  RN updated.   Maternal Data Has patient been taught Hand Expression?: Yes Does the patient have breastfeeding experience prior to this delivery?: Yes  Feeding Mother's Current Feeding Choice: Breast Milk Nipple Type: Slow - flow  LATCH Score Latch: Grasps breast easily, tongue down, lips flanged, rhythmical sucking.  Audible Swallowing: A few with stimulation  Type of Nipple: Everted at rest and after stimulation  Comfort (Breast/Nipple): Filling, red/small blisters or bruises, mild/mod discomfort  Hold (Positioning): Assistance needed to correctly position infant at breast and maintain latch.  LATCH Score: 7   Lactation Tools Discussed/Used    Interventions    Discharge Discharge Education: Engorgement and breast care  Consult Status Consult Status: Complete    Darwyn Ponzo R Zeev Deakins 12/27/2020, 10:03 AM

## 2020-12-27 NOTE — Progress Notes (Signed)
  Patient is eating, ambulating, voiding.  Pain control is good.  Vitals:   12/26/20 1600 12/27/20 0134 12/27/20 0557 12/27/20 0738  BP: (!) 145/98 (!) 133/103 (!) 136/93 (!) 127/94  Pulse: 89 93 83 89  Resp:  18 18   Temp:  98.2 F (36.8 C) 98.3 F (36.8 C)   TempSrc:  Oral Oral   SpO2:  97% 98%   Weight:      Height:        lungs:   clear to auscultation cor:    RRR Abdomen:  soft, appropriate tenderness, incisions intact and without erythema or exudate ex:    no cords   Lab Results  Component Value Date   WBC 23.9 (H) 12/25/2020   HGB 13.2 12/25/2020   HCT 39.1 12/25/2020   MCV 89.9 12/25/2020   PLT 219 12/25/2020    --/--/O POS (09/20 0912)/RI  A/P    Post operative day 3. BPs yesterday were elevated, procardia 30 added and pt given Procardia 90 mg XL this am.  Still not well controlled, adding labetalol 200 bid. Otherwise routine post op and postpartum care.  Expect d/c later today or tomorrow.  Oxy for pain control.

## 2020-12-27 NOTE — Progress Notes (Addendum)
Pt and husband wanting to go home and are willing to sign out AMA.  I disagree with them leaving after only one BP post new treatment regimen.  They are signing out AMA and will need to f/u with [facility] in 1 week for BP check.

## 2020-12-27 NOTE — Progress Notes (Signed)
Pt very motivated to go home.  She does not want to stay but I explained the need to make sure her blood pressures were completely under control before d/c.  Will consider D/C this pm if bps are normalized with the addition of the labetalol.  Carrington Clamp

## 2020-12-27 NOTE — Discharge Summary (Addendum)
   Postpartum Discharge Summary  Date of Service updated   Pt signed out against medical advice.    Patient Name: Nancy Bennett DOB: 06/29/1985 MRN: [id number]  Date of admission: 12/24/2020 Delivery date:12/24/2020  Delivering provider: MARINONE, MICHELLE E  Date of discharge: 12/27/2020  Admitting diagnosis: Chronic hypertension affecting pregnancy [O10.919] Intrauterine pregnancy: [redacted]w[redacted]d     Secondary diagnosis:  Active Problems:   Chronic hypertension affecting pregnancy  Additional problems: chronic hypertension, worsened pp    Discharge diagnosis: Term Pregnancy Delivered and CHTN                                              Post partum procedures: none Augmentation: N/A Complications: None  Hospital course: Sceduled C/S   35 y.o. yo G4P3013 at [redacted]w[redacted]d was admitted to the hospital 12/24/2020 for scheduled cesarean section with the following indication:Elective Repeat.Delivery details are as follows:  Membrane Rupture Time/Date: 12:22 PM ,12/24/2020   Delivery Method:C-Section, Low Transverse  Details of operation can be found in separate operative note.  Patient had an uncomplicated postpartum course.  She is ambulating, tolerating a regular diet, passing flatus, and urinating well. Patient is discharged home in stable condition on  12/27/20        Newborn Data: Birth date:12/24/2020  Birth time:12:22 PM  Gender:Female  Living status:Living  Apgars:9 ,9  Weight:3140 g     Magnesium Sulfate received: No BMZ received: No Rhophylac:No MMR:No T-DaP:Given prenatally Flu: No Transfusion:No  Physical exam  Vitals:   12/27/20 0134 12/27/20 0557 12/27/20 0738 12/27/20 1128  BP: (!) 133/103 (!) 136/93 (!) 127/94 112/77  Pulse: 93 83 89 88  Resp: 18 18    Temp: 98.2 F (36.8 C) 98.3 F (36.8 C)    TempSrc: Oral Oral    SpO2: 97% 98%    Weight:      Height:        Labs: Lab Results  Component Value Date   WBC 23.9 (H) 12/25/2020   HGB 13.2 12/25/2020   HCT 39.1  12/25/2020   MCV 89.9 12/25/2020   PLT 219 12/25/2020   CMP Latest Ref Rng & Units 12/22/2020  Glucose 70 - 99 mg/dL 78  BUN 6 - 20 mg/dL 13  Creatinine 0.44 - 1.00 mg/dL 0.81  Sodium 135 - 145 mmol/L 133(L)  Potassium 3.5 - 5.1 mmol/L 4.3  Chloride 98 - 111 mmol/L 104  CO2 22 - 32 mmol/L 20(L)  Calcium 8.9 - 10.3 mg/dL 9.0  Total Protein 6.5 - 8.1 g/dL 6.4(L)  Total Bilirubin 0.3 - 1.2 mg/dL 0.5  Alkaline Phos 38 - 126 U/L 200(H)  AST 15 - 41 U/L 24  ALT 0 - 44 U/L 18   Edinburgh Score: Edinburgh Postnatal Depression Scale Screening Tool 12/24/2020  I have been able to laugh and see the funny side of things. 0  I have looked forward with enjoyment to things. 0  I have blamed myself unnecessarily when things went wrong. 1  I have been anxious or worried for no good reason. 0  I have felt scared or panicky for no good reason. 0  Things have been getting on top of me. 0  I have been so unhappy that I have had difficulty sleeping. 0  I have felt sad or miserable. 0  I have been so unhappy that I have been crying.   0  The thought of harming myself has occurred to me. 0  Edinburgh Postnatal Depression Scale Total 1      After visit meds:  Allergies as of 12/27/2020       Reactions   Cephalosporins Hives   Minocin [minocycline Hcl] Hives        Medication List     STOP taking these medications    aspirin EC 81 MG tablet       TAKE these medications    ALLEGRA PO Take 1 tablet by mouth daily.   labetalol 200 MG tablet Commonly known as: NORMODYNE Take 1 tablet (200 mg total) by mouth 2 (two) times daily.   NIFEdipine 90 MG 24 hr tablet Commonly known as: PROCARDIA XL/NIFEDICAL-XL Take 1 tablet (90 mg total) by mouth daily. Start taking on: December 28, 2020 What changed:  medication strength how much to take   omeprazole 20 MG capsule Commonly known as: PRILOSEC Take 20 mg by mouth daily as needed (Heartburn).   oxyCODONE 5 MG immediate release  tablet Commonly known as: Oxy IR/ROXICODONE Take 1-2 tablets (5-10 mg total) by mouth every 4 (four) hours as needed for moderate pain.   prenatal multivitamin Tabs tablet Take 1 tablet by mouth daily at 12 noon.               Discharge Care Instructions  (From admission, onward)           Start     Ordered   12/27/20 0000  Discharge wound care:       Comments: Sitz baths and icepacks to perineum.  If stitches, they will dissolve.   12/27/20 1141   12/27/20 0000  Discharge wound care:       Comments: Sitz baths and icepacks to perineum.  If stitches, they will dissolve.   12/27/20 1146             Discharge home in stable condition Infant Feeding:  ? Infant Disposition:home with mother Discharge instruction: per After Visit Summary and Postpartum booklet. Activity: Advance as tolerated. Pelvic rest for 6 weeks.  Diet: low salt diet Anticipated Birth Control: Unsure Postpartum Appointment:1 week Additional Postpartum F/U: BP check 1 week Future Appointments:No future appointments. Follow up Visit:  Follow-up Information     Rowland Lathe, MD Follow up.   Specialty: Obstetrics and Gynecology Contact information: [address] [place] [address] [phone]                     12/27/2020 Daria Pastures, MD

## 2020-12-28 LAB — SURGICAL PATHOLOGY

## 2021-01-05 ENCOUNTER — Telehealth (HOSPITAL_COMMUNITY): Payer: Self-pay

## 2021-01-05 NOTE — Telephone Encounter (Signed)
"  I'm doing good. I had a recheck with my OB-GYN yesterday. My BP was low so they took me off one of the BP medications." Patient declines any questions or concerns about her healing.  "He is doing really good. He likes to sleep during the day and party all night. He has his nights and days mixed up. He has an appointment on Friday with the pediatrician. He is doing good. He sleeps in a bassinet." RN reviewed ABC's of safe sleep with patient. Patient declines any questions or concerns about baby.  EPDS score is 2.  Marcelino Duster [facility] 01/05/2021,1240

## 2022-02-08 ENCOUNTER — Other Ambulatory Visit: Payer: Self-pay | Admitting: Physician Assistant

## 2022-02-08 DIAGNOSIS — R4189 Other symptoms and signs involving cognitive functions and awareness: Secondary | ICD-10-CM

## 2022-02-12 ENCOUNTER — Ambulatory Visit
Admission: RE | Admit: 2022-02-12 | Discharge: 2022-02-12 | Disposition: A | Discharge status: Home / Self Care | Payer: 59 | Source: Ambulatory Visit | Attending: Physician Assistant | Admitting: Physician Assistant

## 2022-02-12 DIAGNOSIS — R4189 Other symptoms and signs involving cognitive functions and awareness: Secondary | ICD-10-CM

## 2022-02-12 MED ORDER — GADOPICLENOL 0.5 MMOL/ML IV SOLN
9.0000 mL | Freq: Once | INTRAVENOUS | Status: AC | PRN
  Administered 2022-02-12: 9 mL via INTRAVENOUS

## 2022-06-21 ENCOUNTER — Other Ambulatory Visit (HOSPITAL_BASED_OUTPATIENT_CLINIC_OR_DEPARTMENT_OTHER): Payer: Self-pay | Admitting: Obstetrics and Gynecology

## 2022-06-21 DIAGNOSIS — N939 Abnormal uterine and vaginal bleeding, unspecified: Secondary | ICD-10-CM

## 2022-07-19 ENCOUNTER — Other Ambulatory Visit: Payer: 59

## 2022-09-09 IMAGING — US US MFM OB DETAIL+14 WK
1 series · 13 of 28 positions shown · non-contrast
Comparison: none

[Series 1: us mfm ob detail+14 wk · 202 acquisitions, 13 frames shown]
[im 8/202]
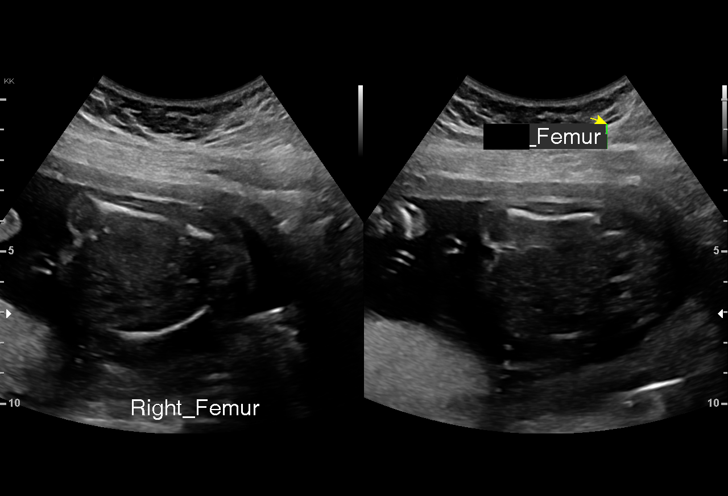
[im 23/202]
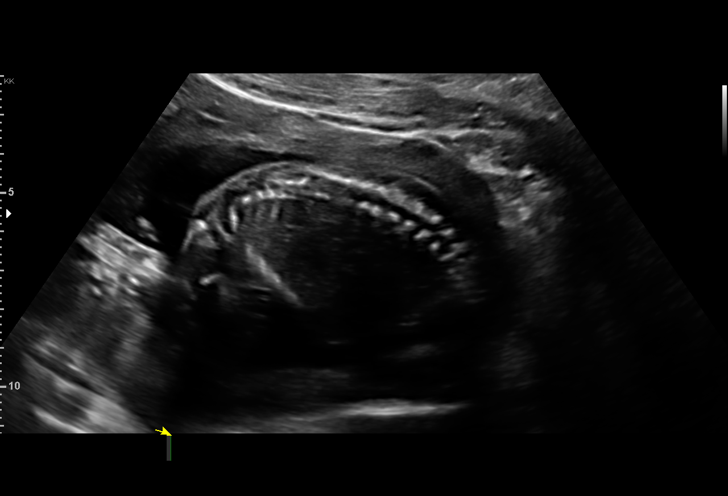
[im 38/202]
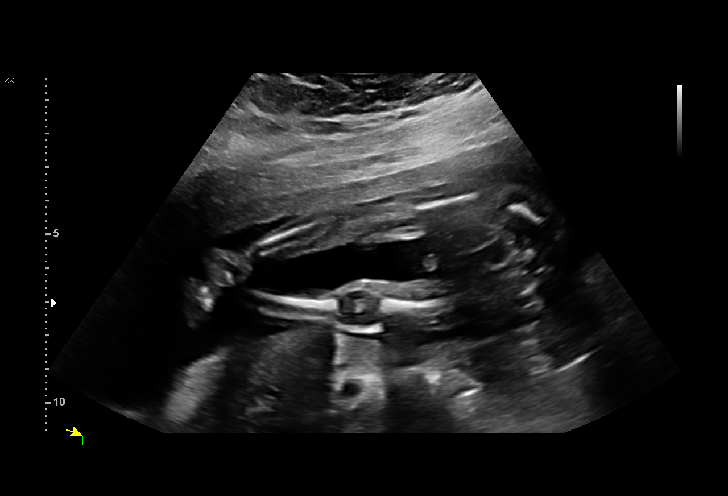
[im 53/202]
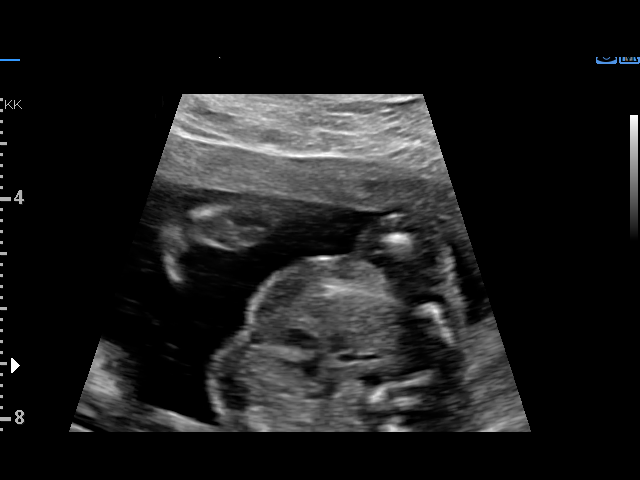
[im 68/202]
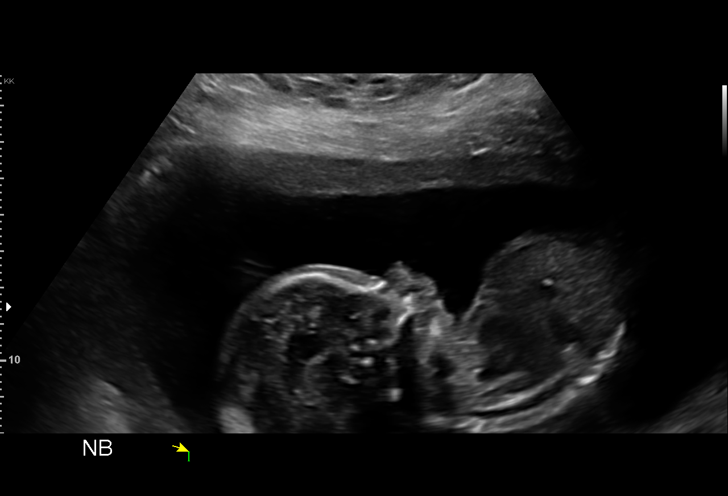
[im 82/202]
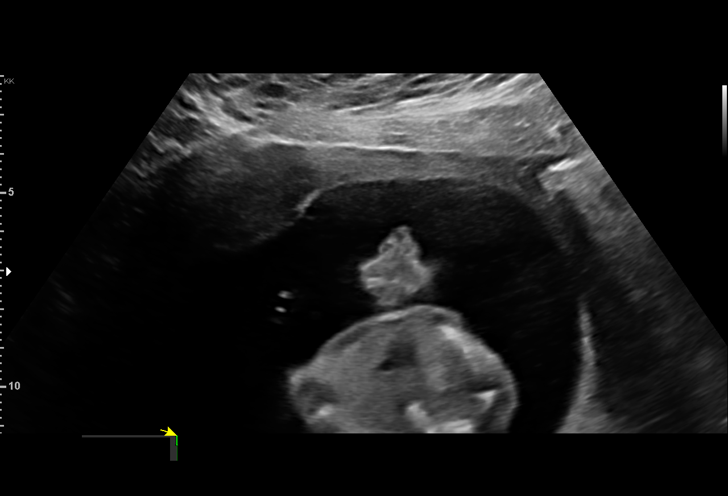
[im 105/202]
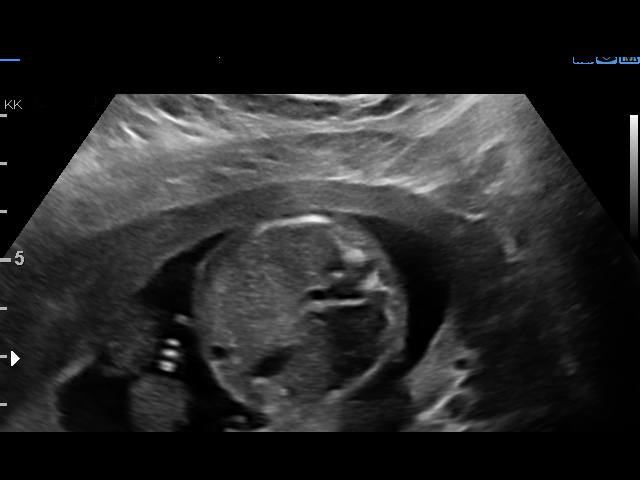
[im 120/202]
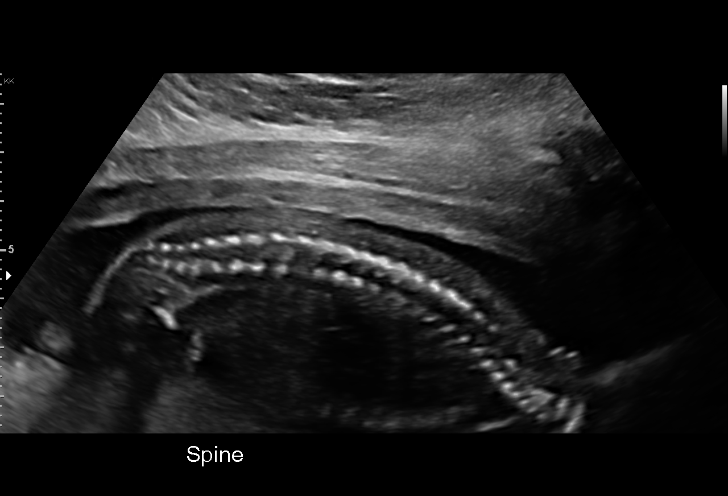
[im 135/202]
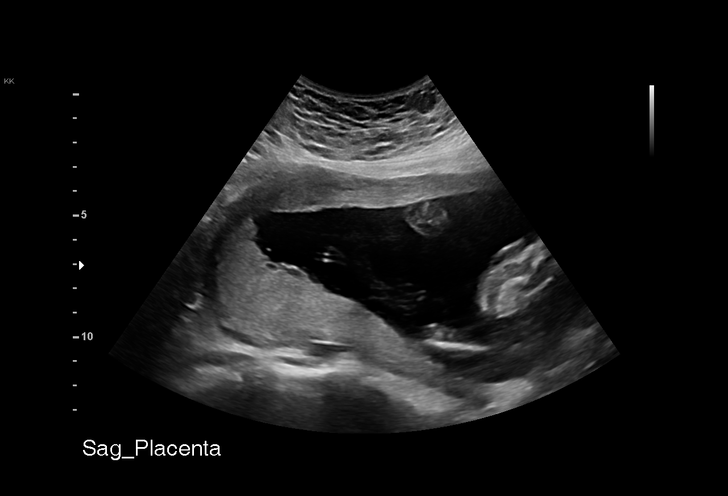
[im 149/202]
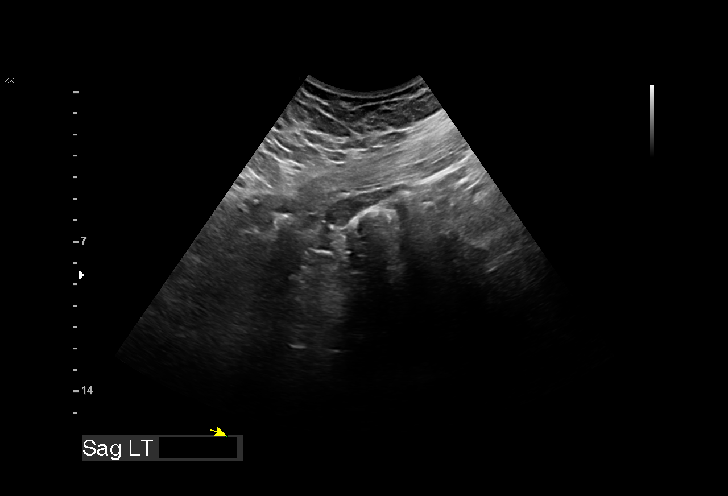
[im 164/202]
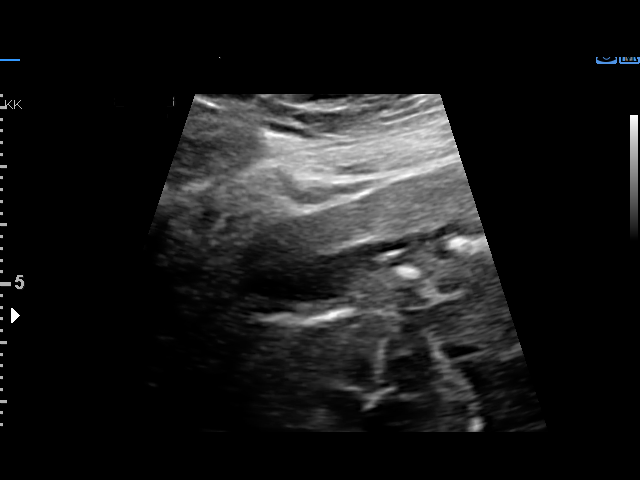
[im 179/202]
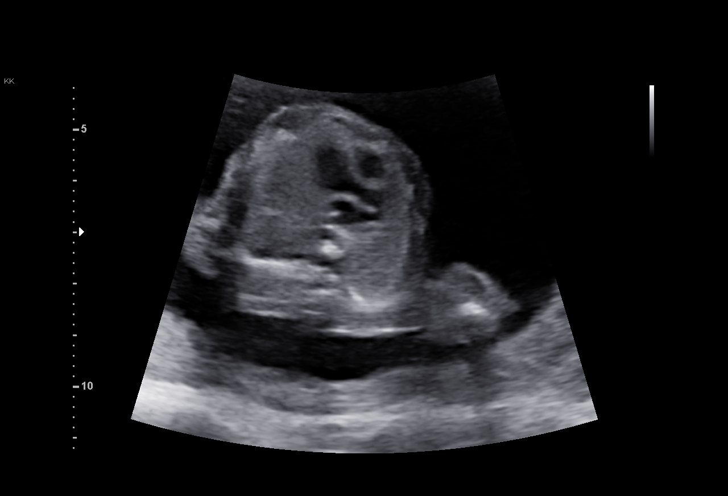
[im 194/202]
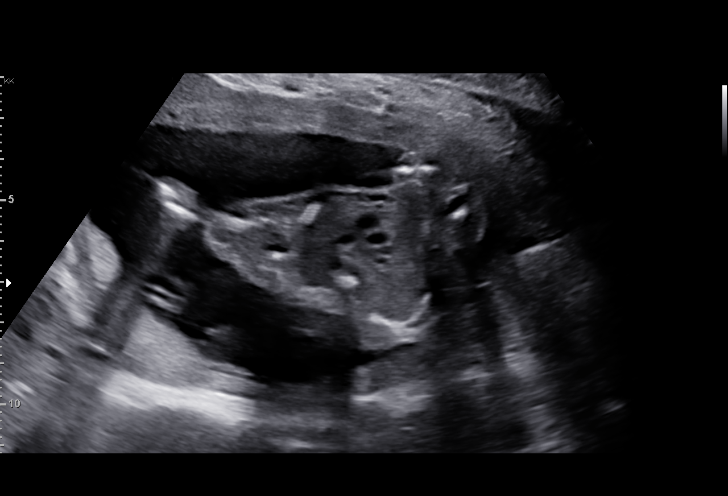

[13 of 28 positions shown; findings below may reference images not displayed]

OBGYN
                                                            [REDACTED]
                   MD AMEEN

Indications

 Hypertension - Chronic/Pre-existing
 (Procardia)
 Advanced maternal age multigravida 35+,
 second trimester (35 yrs at time of delivery)
 19 weeks gestation of pregnancy
 Marginal insertion of umbilical cord affecting
 management of mother in second trimester
 Encounter for antenatal screening for
 malformations
 History of cesarean delivery, currently
 pregnant x 2
Vital Signs

 BMI:
Fetal Evaluation

 Num Of Fetuses:         1
 Fetal Heart Rate(bpm):  158
 Cardiac Activity:       Observed
 Presentation:           Breech
 Placenta:               Posterior
 P. Cord Insertion:      Visualized
 Amniotic Fluid
 AFI FV:      Within normal limits

                             Largest Pocket(cm)

Biometry

 BPD:      49.6  mm     G. Age:  21w 0d         97  %    CI:        75.29   %    70 - 86
                                                         FL/HC:      17.3   %    16.1 -
 HC:      181.3  mm     G. Age:  20w 4d         91  %    HC/AC:      1.18        1.09 -
 AC:      154.1  mm     G. Age:  20w 4d         85  %    FL/BPD:     63.3   %
 FL:       31.4  mm     G. Age:  19w 5d         60  %    FL/AC:      20.4   %    20 - 24
 CER:      21.6  mm     G. Age:  20w 3d         90  %
 NFT:       2.7  mm

 LV:        9.4  mm
 CM:        6.7  mm

 Est. FW:     343  gm    0 lb 12 oz      93  %
OB History

 Gravidity:    3         Term:   2        Prem:   0        SAB:   0
 TOP:          0       Ectopic:  0        Living: 2
Gestational Age

 LMP:           19w 2d        Date:  03/30/20                 EDD:   01/04/21
 U/S Today:     20w 3d                                        EDD:   12/27/20
 Best:          19w 2d     Det. By:  LMP  (03/30/20)          EDD:   01/04/21
Anatomy

 Cranium:               Appears normal         Aortic Arch:            Appears normal
 Cavum:                 Appears normal         Ductal Arch:            Appears normal
 Ventricles:            Appears normal         Diaphragm:              Appears normal
 Choroid Plexus:        Appears normal         Stomach:                Appears normal, left
                                                                       sided
 Cerebellum:            Appears normal         Abdomen:                Appears normal
 Posterior Fossa:       Appears normal         Abdominal Wall:         Appears nml (cord
                                                                       insert, abd wall)
 Nuchal Fold:           Appears normal         Cord Vessels:           Appears normal (3
                                                                       vessel cord)
 Face:                  Appears normal         Kidneys:                Appear normal
                        (orbits and profile)
 Lips:                  Appears normal         Bladder:                Appears normal
 Thoracic:              Appears normal         Spine:                  Appears normal
 Heart:                 Appears normal         Upper Extremities:      Appears normal
                        (4CH, axis, and
                        situs)
 RVOT:                  Appears normal         Lower Extremities:      Appears normal
 LVOT:                  Appears normal

 Other:  Fetus appears to be a male. Right Heel visualized. Technically
         difficult due to fetal position.
Cervix Uterus Adnexa
 Cervix
 Length:           3.23  cm.
 Normal appearance by transabdominal scan.

 Adnexa
 No abnormality visualized.
Comments

 This patient was seen for a detailed fetal anatomy scan due
 to advanced maternal age and chronic hypertension currently
 treated with Procardia 30 mg daily.
 She denies any other significant past medical history and
 denies any problems in her current pregnancy.
 The patient reports that she had a first trimester nuchal
 translucency screening test performed in your office that
 indicated a low risk for Down syndrome.  The results of that
 test was not available in our office today.
 She was informed that the fetal growth and amniotic fluid
 level were appropriate for her gestational age.
 There were no obvious fetal anomalies noted on today's
 ultrasound exam.
 The patient was informed that anomalies may be missed due
 to technical limitations. If the fetus is in a suboptimal position
 or maternal habitus is increased, visualization of the fetus in
 the maternal uterus may be impaired.
 SuchThe implications and management of chronic
 hypertension in pregnancy was discussed. The patient was
 advised that should her blood pressures continue to be
 elevated, the dosage of her antihypertensive medications
 may need to be increased.  The increased risk of
 superimposed preeclampsia, an indicated preterm delivery,
 and possible fetal growth restriction due to chronic
 hypertension in pregnancy was discussed. We will continue
 to follow her with monthly growth scans. Weekly fetal testing
 should be started at around 32 weeks.
 To decrease her risk of superimposed preeclampsia, she
 should continue taking a daily baby aspirin (81 mg daily) for
 preeclampsia prophylaxis.
 The increased risk of fetal aneuploidy due to advanced
 maternal age was discussed. Due to advanced maternal age,
 the patient was offered and declined an amniocentesis today
 for definitive diagnosis of fetal aneuploidy.
 A follow-up exam was scheduled in 4 weeks to assess the
 fetal growth.

## 2022-12-22 IMAGING — US US MFM OB FOLLOW-UP
1 series · 14 of 28 positions shown · non-contrast
Comparison: none

[Series 1: us mfm ob follow-up · 33 acquisitions, 14 frames shown]
[im 2/33]
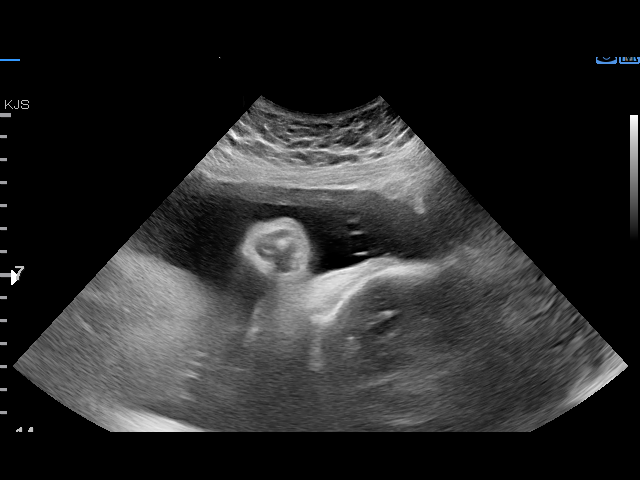
[im 4/33]
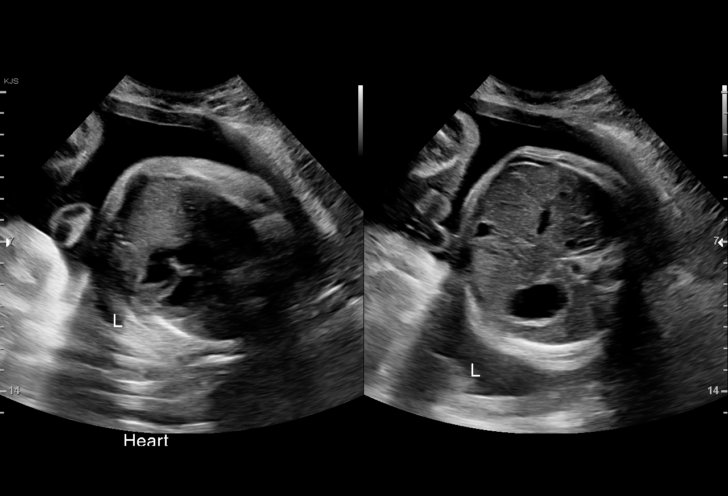
[im 6/33]
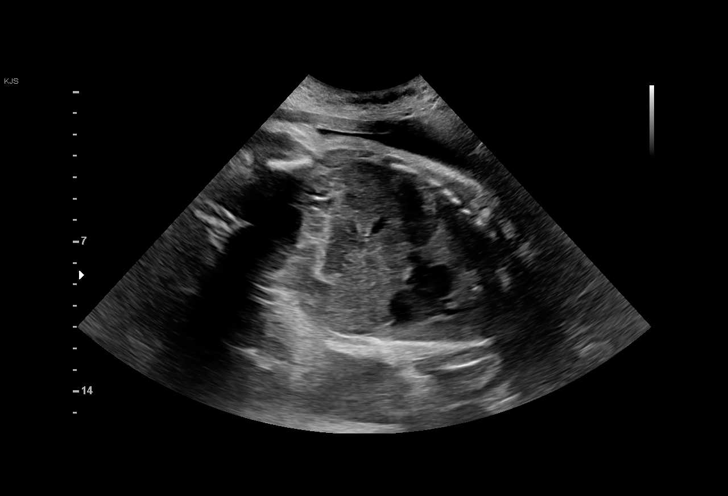
[im 9/33]
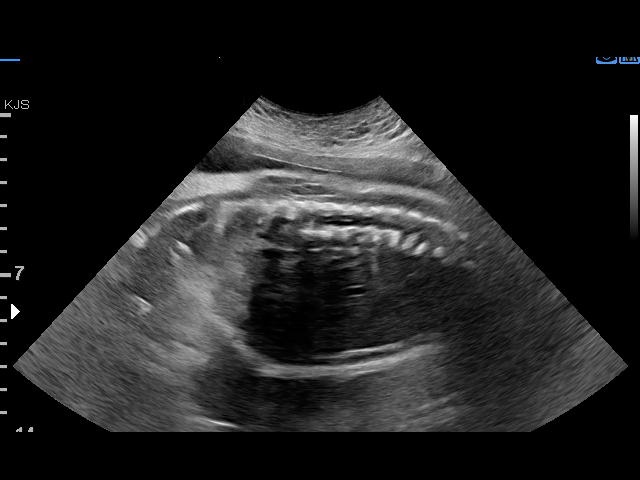
[im 11/33]
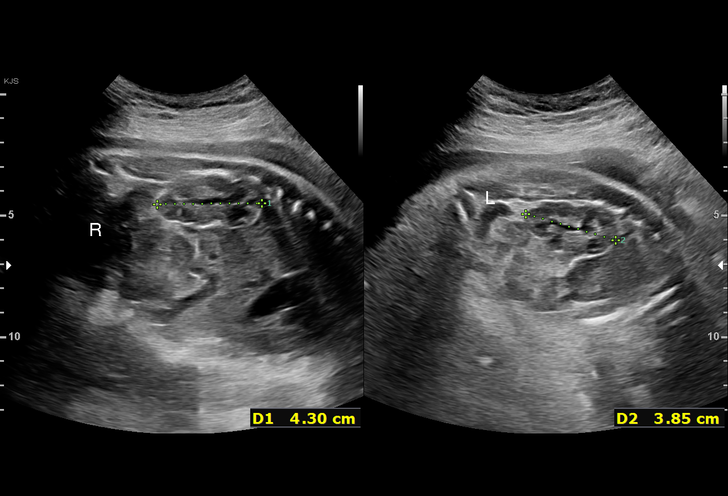
[im 14/33]
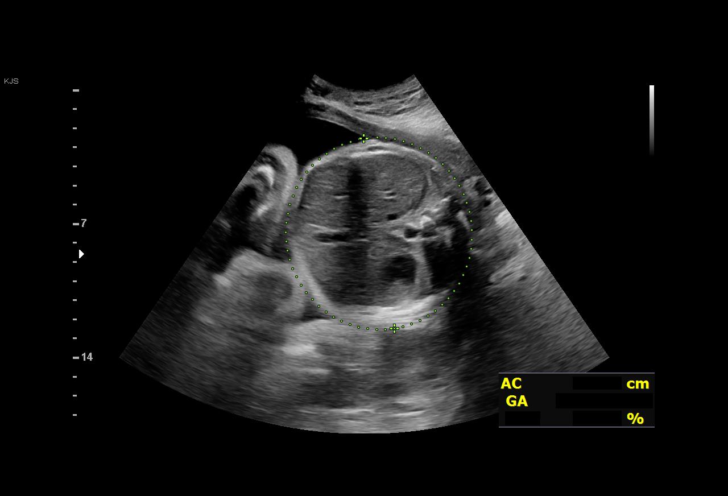
[im 16/33]
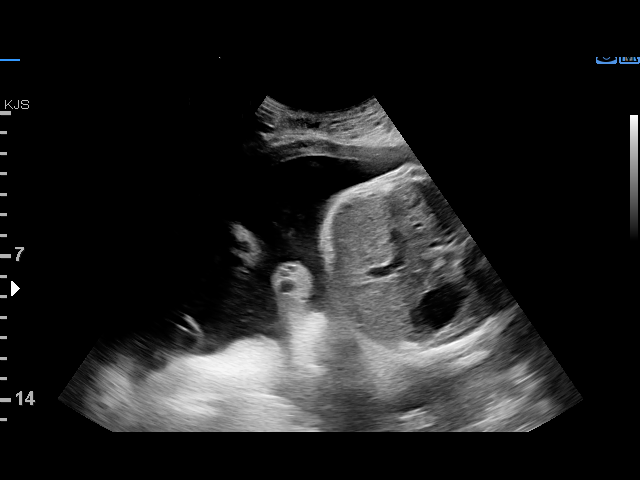
[im 18/33]
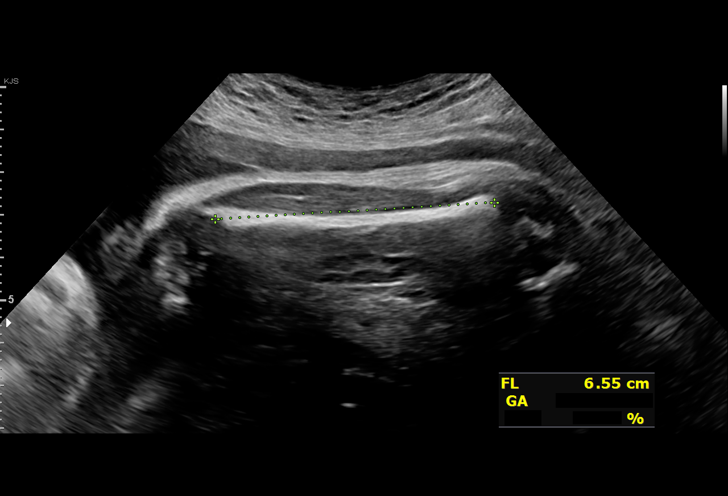
[im 21/33]
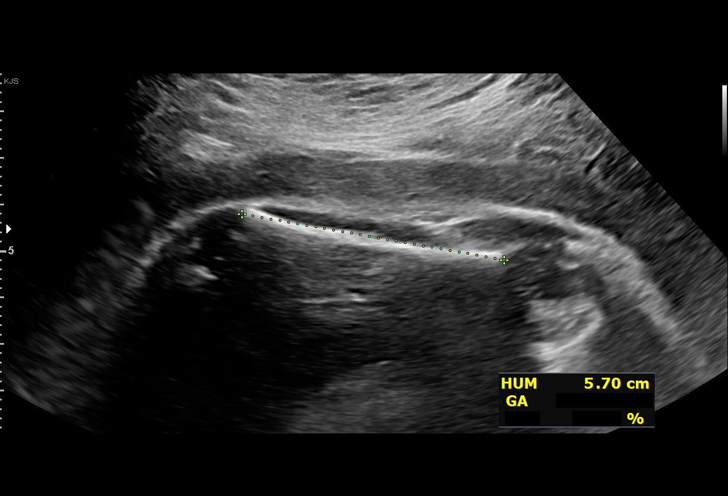
[im 23/33]
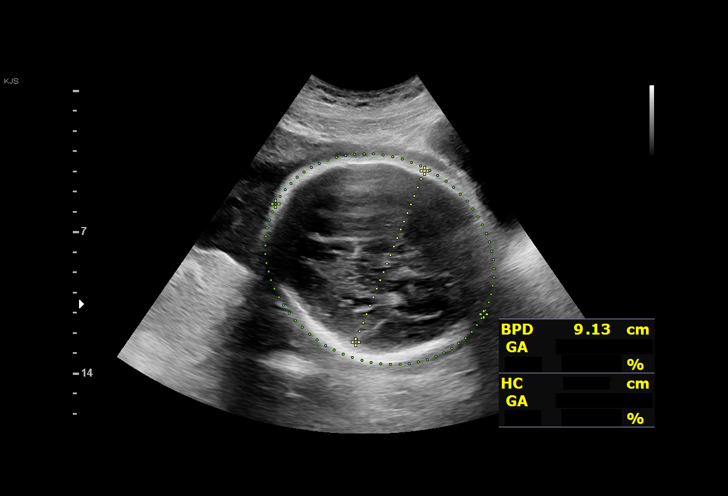
[im 25/33]
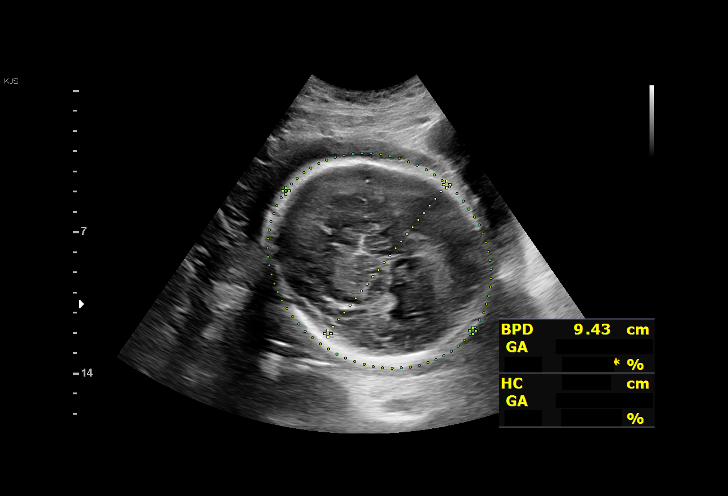
[im 28/33]
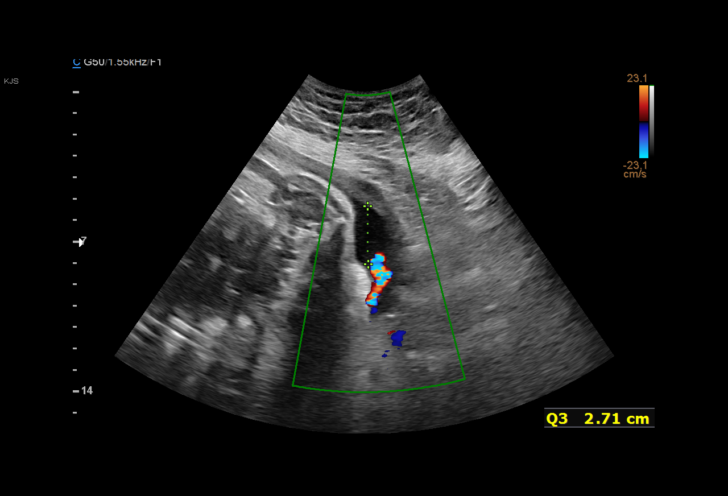
[im 30/33]
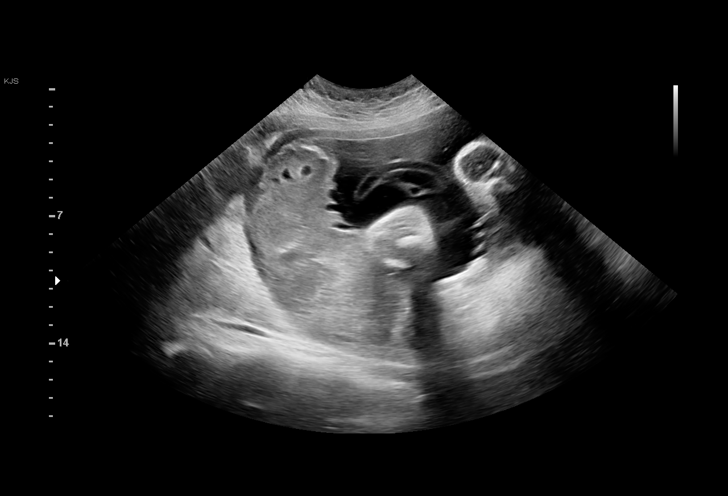
[im 33/33]
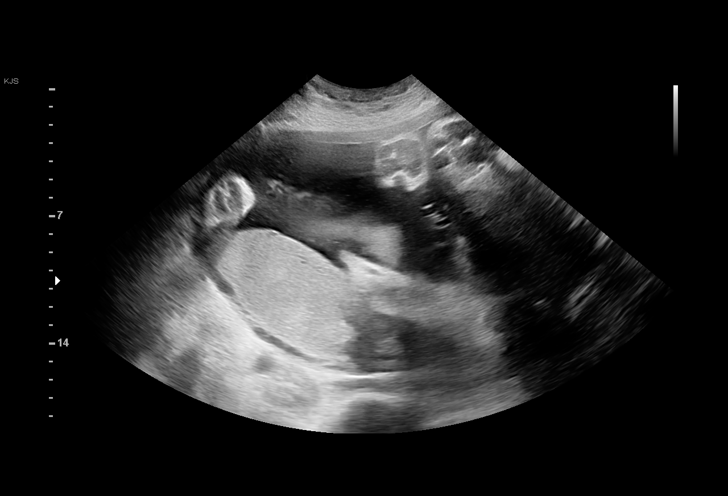

[14 of 28 positions shown; findings below may reference images not displayed]

OBGYN
                                                            [REDACTED]
                   LESLENA

Indications

 Hypertension - Chronic/Pre-existing
 (Procardia)
 34 weeks gestation of pregnancy
 Advanced maternal age multigravida 35+,
 second trimester (35 yrs at time of delivery)
 Marginal insertion of umbilical cord affecting
 management of mother in second trimester
 History of cesarean delivery, currently
 pregnant x 2
Vital Signs

                                                Height:        5'7"
Fetal Evaluation

 Num Of Fetuses:         1
 Fetal Heart Rate(bpm):  140
 Cardiac Activity:       Observed
 Presentation:           Cephalic
 Placenta:               Posterior
 P. Cord Insertion:      Previously Visualized

 Amniotic Fluid
 AFI FV:      Within normal limits

 AFI Sum(cm)     %Tile       Largest Pocket(cm)
 16.42           60
 RUQ(cm)       RLQ(cm)       LUQ(cm)        LLQ(cm)

Biophysical Evaluation

 Amniotic F.V:   Pocket => 2 cm             F. Tone:        Observed
 F. Movement:    Observed                   Score:          [DATE]
 F. Breathing:   Not Observed
Biometry

 BPD:      93.5  mm     G. Age:  38w 0d       > 99  %    CI:         75.7   %    70 - 86
                                                         FL/HC:      19.2   %    19.4 -
 HC:      340.7  mm     G. Age:  39w 2d       > 99  %    HC/AC:      1.10        0.96 -
 AC:       311   mm     G. Age:  35w 0d         79  %    FL/BPD:     70.1   %    71 - 87
 FL:       65.5  mm     G. Age:  33w 5d         30  %    FL/AC:      21.1   %    20 - 24
 HUM:      57.3  mm     G. Age:  33w 2d         41  %

 Est. FW:    2486  gm    5 lb 14 oz      81  %
OB History

 Gravidity:    3         Term:   2        Prem:   0        SAB:   0
 TOP:          0       Ectopic:  0        Living: 2
Gestational Age

 LMP:           34w 1d        Date:  03/30/20                 EDD:   01/04/21
 U/S Today:     36w 4d                                        EDD:   12/18/20
 Best:          34w 1d     Det. By:  LMP  (03/30/20)          EDD:   01/04/21
Anatomy

 Cranium:               Appears normal         Kidneys:                Appear normal
 Diaphragm:             Appears normal         Bladder:                Appears normal
 Stomach:               Appears normal, left
                        sided
Cervix Uterus Adnexa

 Cervix
 Not visualized (advanced GA >08wks)
Impression

 Follow up growth due to AMA, MCI and chronic hypertension
 Normal interval growth with measurements consistent with
 dates
 Good fetal movement and amniotic fluid volume
 Biophysical profile [DATE]
Recommendations

 Continue weekly testing
 Consider delivery between 37-39 weeks.

## 2023-11-30 ENCOUNTER — Other Ambulatory Visit: Payer: Self-pay | Admitting: Family Medicine

## 2023-11-30 DIAGNOSIS — N63 Unspecified lump in unspecified breast: Secondary | ICD-10-CM
